# Patient Record
Sex: Male | Born: 1975 | Race: White | Hispanic: No | Marital: Married | Smoking: Never smoker
Health system: Southern US, Community
[De-identification: ages and names within clinical notes are randomized; demographics above are authoritative.]

## PROBLEM LIST (undated history)

## (undated) DIAGNOSIS — F410 Panic disorder [episodic paroxysmal anxiety] without agoraphobia: Secondary | ICD-10-CM

## (undated) DIAGNOSIS — G4730 Sleep apnea, unspecified: Secondary | ICD-10-CM

## (undated) HISTORY — DX: Sleep apnea, unspecified: G47.30

## (undated) HISTORY — DX: Panic disorder (episodic paroxysmal anxiety): F41.0

## (undated) HISTORY — PX: CHOLECYSTECTOMY: SHX55

---

## 2017-04-15 ENCOUNTER — Ambulatory Visit: Payer: Self-pay | Admitting: Osteopathic Medicine

## 2017-04-23 ENCOUNTER — Encounter: Payer: Self-pay | Admitting: Osteopathic Medicine

## 2017-04-23 ENCOUNTER — Ambulatory Visit (INDEPENDENT_AMBULATORY_CARE_PROVIDER_SITE_OTHER): Payer: 59 | Admitting: Osteopathic Medicine

## 2017-04-23 VITALS — BP 123/75 | HR 82 | Ht 72.0 in | Wt 240.0 lb

## 2017-04-23 DIAGNOSIS — F410 Panic disorder [episodic paroxysmal anxiety] without agoraphobia: Secondary | ICD-10-CM | POA: Insufficient documentation

## 2017-04-23 DIAGNOSIS — F411 Generalized anxiety disorder: Secondary | ICD-10-CM

## 2017-04-23 LAB — CBC
HCT: 44.3 % (ref 38.5–50.0)
Hemoglobin: 15.1 g/dL (ref 13.2–17.1)
MCH: 31.2 pg (ref 27.0–33.0)
MCHC: 34.1 g/dL (ref 32.0–36.0)
MCV: 91.5 fL (ref 80.0–100.0)
MPV: 10.8 fL (ref 7.5–12.5)
PLATELETS: 283 10*3/uL (ref 140–400)
RBC: 4.84 10*6/uL (ref 4.20–5.80)
RDW: 12.1 % (ref 11.0–15.0)
WBC: 10.1 10*3/uL (ref 3.8–10.8)

## 2017-04-23 LAB — COMPLETE METABOLIC PANEL WITH GFR
AG Ratio: 1.8 (calc) (ref 1.0–2.5)
ALBUMIN MSPROF: 4.5 g/dL (ref 3.6–5.1)
ALKALINE PHOSPHATASE (APISO): 90 U/L (ref 40–115)
ALT: 16 U/L (ref 9–46)
AST: 24 U/L (ref 10–40)
BILIRUBIN TOTAL: 0.5 mg/dL (ref 0.2–1.2)
BUN / CREAT RATIO: 26 (calc) — AB (ref 6–22)
BUN: 33 mg/dL — ABNORMAL HIGH (ref 7–25)
CHLORIDE: 104 mmol/L (ref 98–110)
CO2: 28 mmol/L (ref 20–32)
Calcium: 9.4 mg/dL (ref 8.6–10.3)
Creat: 1.25 mg/dL (ref 0.60–1.35)
GFR, Est African American: 84 mL/min/{1.73_m2} (ref >=60)
GFR, Est Non African American: 72 mL/min/{1.73_m2} (ref >=60)
GLOBULIN: 2.5 g/dL (ref 1.9–3.7)
Glucose, Bld: 93 mg/dL (ref 65–99)
Potassium: 4.1 mmol/L (ref 3.5–5.3)
SODIUM: 139 mmol/L (ref 135–146)
Total Protein: 7 g/dL (ref 6.1–8.1)

## 2017-04-23 LAB — TSH: TSH: 1.12 m[IU]/L (ref 0.40–4.50)

## 2017-04-23 MED ORDER — HYDROXYZINE HCL 25 MG PO TABS: 25.0000 mg | ORAL_TABLET | Freq: Three times a day (TID) | ORAL | 1 refills | Status: DC | PRN

## 2017-04-23 MED ORDER — ESCITALOPRAM OXALATE 10 MG PO TABS: 10.0000 mg | ORAL_TABLET | Freq: Every day | ORAL | 1 refills | Status: DC

## 2017-04-23 NOTE — Progress Notes (Signed)
HPI: Marc Wiggins is a 41 y.o. male who  has no past medical history on file.  [facility] today, 04/23/17,  for chief complaint of:  Chief Complaint  Patient presents with  . Establish Care    ANXIETY    Anxiety/irritability and difficulty sleeping for the past few months. Notes that anxiety issues have been a problem all of his life but seem to be getting worse lately. Wife encouraged him to come seek care. He has already been to one session with a counselor last week. Would like to continue doing this. Greatest issue is he feels like he cannot shut his mind down, this definitely affects sleeping. His grandmother gave him alprazolam which she has been taking to help him sleep. This works quite well.  Depression screen PHQ 2/9 04/23/2017  Decreased Interest 0  Down, Depressed, Hopeless 1  PHQ - 2 Score 1   GAD 7 : Generalized Anxiety Score 04/23/2017  Nervous, Anxious, on Edge 3  Control/stop worrying 3  Worry too much - different things 3  Trouble relaxing 3  Restless 3  Easily annoyed or irritable 3  Afraid - awful might happen 3  Total GAD 7 Score 21   Mood disorder questionnaire negative  Past medical, surgical, social and family history reviewed:  There are no active problems to display for this patient.   Past Surgical History:  Procedure Laterality Date  . CHOLECYSTECTOMY      Social History   Tobacco Use  . Smoking status: Never Smoker  . Smokeless tobacco: Never Used  Substance Use Topics  . Alcohol use: Not on file    Family History  Problem Relation Age of Onset  . Hypertension Father   . Diabetes Maternal Aunt   . Stroke Maternal Grandmother   . Diabetes Maternal Grandmother   . Heart attack Maternal Grandmother   . Cancer Paternal Grandmother      Current medication list and allergy/intolerance information reviewed:    Current Outpatient Medications  Medication Sig Dispense Refill  .  fexofenadine (ALLEGRA) 180 MG tablet Take 180 mg daily by mouth.    . Multiple Vitamin (MULTIVITAMIN) tablet Take 1 tablet daily by mouth.     No current facility-administered medications for this visit.     Allergies  Allergen Reactions  . Hydrocodone       Review of Systems:  Constitutional:  No  fever, no chills, No recent illness, No unintentional weight changes. No significant fatigue.   HEENT: No  headache, no vision change, no hearing change, No sore throat, No  sinus pressure  Cardiac: No  chest pain, No  pressure, No palpitations, No  Orthopnea  Respiratory:  No  shortness of breath. No  Cough  Gastrointestinal: No  abdominal pain, No  nausea, No  vomiting,  No  blood in stool, No  diarrhea, No  constipation   Musculoskeletal: No new myalgia/arthralgia  Genitourinary: No  incontinence, No  abnormal genital bleeding, No abnormal genital discharge  Skin: No  Rash, No other wounds/concerning lesions  Hem/Onc: No  easy bruising/bleeding, No  abnormal lymph node  Endocrine: No cold intolerance,  No heat intolerance. No polyuria/polydipsia/polyphagia   Neurologic: No  weakness, No  dizziness, No  slurred speech/focal weakness/facial droop  Psychiatric: No  concerns with depression, +concerns with anxiety, +sleep problems, No mood problems  Exam:  BP 123/75   Pulse 82   Ht 6' (1.829 m)   Wt 240 lb (  108.9 kg)   BMI 32.55 kg/m    Constitutional: VS see above. General Appearance: alert, well-developed, well-nourished, NAD  Eyes: Normal lids and conjunctive, non-icteric sclera  Ears, Nose, Mouth, Throat: MMM, Normal external inspection ears/nares/mouth/lips/gums. TM normal bilaterally. Pharynx/tonsils no erythema, no exudate. Nasal mucosa normal.   Neck: No masses, trachea midline. No thyroid enlargement. No tenderness/mass appreciated. No lymphadenopathy  Respiratory: Normal respiratory effort. no wheeze, no rhonchi, no rales  Cardiovascular: S1/S2 normal, no  murmur, no rub/gallop auscultated. RRR. No lower extremity edema  Gastrointestinal: Nontender, no masses. No hepatomegaly, no splenomegaly.   Musculoskeletal: Gait normal. No clubbing/cyanosis of digits.   Neurological: Normal balance/coordination. No tremor. No cranial nerve deficit on limited exam.   Skin: warm, dry, intact. No rash/ulcer. No concerning nevi or subq nodules on limited exam.    Psychiatric: Normal judgment/insight. Normal mood and affect. Oriented x3.     ASSESSMENT/PLAN:   Generalized anxiety disorder - Discussed appropriate daily use of SSRI as first-line therapy, avoid Xanax for sleep. Will trial hydroxyzine. Advised against taking other peoples prescriptions - Plan: escitalopram (LEXAPRO) 10 MG tablet, hydrOXYzine (ATARAX/VISTARIL) 25 MG tablet, CBC, COMPLETE METABOLIC PANEL WITH GFR, TSH    Patient Instructions  We are starting a medication today called lexapro to help treat your anxiety. This is a daily medication to help control your symptoms.   I also highly encourage my patients who are suffering from anxiety to seek care with a counselor or therapist. A therapist can coach you in techniques to recognize and deal with troubling thought patterns and behaviors. The ability to cope with external stressors is crucial to overall mental health.   Expect a call or message form this office in the next 2 weeks to check in: If you're doing well on the medicine but not feeling any effect, we can increase the dose. If you're starting to feel some effect/improvement, we can hold off on a dose increase and reevaluate at your office visit.   Let's plan to follow up in the office in 4 weeks. At that time, we can talk about how well the medicine is working for you, and we can consider increasing the dose, adding another medicine, etc.   If we are having trouble finding a good medication regimen for you, we can consult with a psychiatrist to assist with medication management.    If you experience problematic side effects, please let me know ASAP - we can switch the medicine any time, and we don't need an appointment for this.   Any questions or concerns, call me!      Visit summary with medication list and pertinent instructions was printed for patient to review. All questions at time of visit were answered - patient instructed to contact office with any additional concerns. ER/RTC precautions were reviewed with the patient. Follow-up plan: Return in about 4 weeks (around 05/21/2017) for recheck on new medicines, sooner if needed .  Please note: voice recognition software was used to produce this document, and typos may escape review. Please contact Dr. Lyn HollingsheadAlexander for any needed clarifications.

## 2017-04-23 NOTE — Patient Instructions (Signed)
We are starting a medication today called lexapro to help treat your anxiety. This is a daily medication to help control your symptoms.   I also highly encourage my patients who are suffering from anxiety to seek care with a counselor or therapist. A therapist can coach you in techniques to recognize and deal with troubling thought patterns and behaviors. The ability to cope with external stressors is crucial to overall mental health.   Expect a call or message form this office in the next 2 weeks to check in: If you're doing well on the medicine but not feeling any effect, we can increase the dose. If you're starting to feel some effect/improvement, we can hold off on a dose increase and reevaluate at your office visit.   Let's plan to follow up in the office in 4 weeks. At that time, we can talk about how well the medicine is working for you, and we can consider increasing the dose, adding another medicine, etc.   If we are having trouble finding a good medication regimen for you, we can consult with a psychiatrist to assist with medication management.   If you experience problematic side effects, please let me know ASAP - we can switch the medicine any time, and we don't need an appointment for this.   Any questions or concerns, call me!

## 2017-05-07 ENCOUNTER — Telehealth: Payer: Self-pay | Admitting: Osteopathic Medicine

## 2017-05-07 NOTE — Telephone Encounter (Signed)
Please call patient: Just calling to check up and see how he is doing after we started the Lexapro for anxiety issues a few weeks ago. Any problems or questions, let me know

## 2017-05-07 NOTE — Telephone Encounter (Signed)
-----   Message from Sunnie NielsenNatalie Vara Mairena, DO sent at 04/23/2017  6:27 PM EST ----- Regarding: anxiety Started lexapro 04/23/17

## 2017-05-07 NOTE — Telephone Encounter (Signed)
Left message for a return call

## 2017-05-21 ENCOUNTER — Ambulatory Visit (INDEPENDENT_AMBULATORY_CARE_PROVIDER_SITE_OTHER): Payer: 59 | Admitting: Osteopathic Medicine

## 2017-05-21 ENCOUNTER — Encounter: Payer: Self-pay | Admitting: Osteopathic Medicine

## 2017-05-21 VITALS — BP 121/75 | HR 70 | Wt 242.0 lb

## 2017-05-21 DIAGNOSIS — F411 Generalized anxiety disorder: Secondary | ICD-10-CM

## 2017-05-21 MED ORDER — ESCITALOPRAM OXALATE 10 MG PO TABS: 10.0000 mg | ORAL_TABLET | Freq: Every day | ORAL | 1 refills | Status: DC

## 2017-05-21 MED ORDER — HYDROXYZINE HCL 50 MG PO TABS: 25.0000 mg | ORAL_TABLET | Freq: Three times a day (TID) | ORAL | 1 refills | Status: DC | PRN

## 2017-05-21 MED ORDER — BUPROPION HCL ER (XL) 150 MG PO TB24: 150.0000 mg | ORAL_TABLET | Freq: Every day | ORAL | 1 refills | Status: DC

## 2017-05-21 NOTE — Progress Notes (Signed)
HPI: Marc Wiggins is a 41 y.o. male who  has no past medical history on file.  [facility] to [facility] today, 05/21/17,  for chief complaint of:  Chief Complaint  Patient presents with  . Anxiety    Anxiety/irritability and difficulty sleeping for the past few months. Notes that anxiety issues have been a problem all of his life but seem to be getting worse lately. Wife encouraged him to come seek care. He is following with a Veterinary surgeoncounselor. Would like to continue doing this. Greatest issue is he feels like he cannot shut his mind down, this definitely affects sleeping. His grandmother gave him alprazolam which he had been taking to help him sleep. This was of course working well but he and I discussed at last visit inappropriate use of other people's prescriptions, risks of benzodiazepine use long term.   Last visit one month ago, we started lexapro for daily use and advised Hydroxyzine for sleep aid. He follows up today reporting doing well overall, moods a bit better, but noticing libido/ejaculation difficulty.        Depression screen PHQ 2/9 04/23/2017  Decreased Interest 0  Down, Depressed, Hopeless 1  PHQ - 2 Score 1   GAD 7 : Generalized Anxiety Score 04/23/2017  Nervous, Anxious, on Edge 3  Control/stop worrying 3  Worry too much - different things 3  Trouble relaxing 3  Restless 3  Easily annoyed or irritable 3  Afraid - awful might happen 3  Total GAD 7 Score 21   Mood disorder questionnaire negative  Past medical, surgical, social and family history reviewed:  Patient Active Problem List   Diagnosis Date Noted  . Generalized anxiety disorder 04/23/2017    Past Surgical History:  Procedure Laterality Date  . CHOLECYSTECTOMY      Social History   Tobacco Use  . Smoking status: Never Smoker  . Smokeless tobacco: Never Used  Substance Use Topics  . Alcohol use: Not on file    Family History  Problem Relation Age of  Onset  . Hypertension Father   . Diabetes Maternal Aunt   . Stroke Maternal Grandmother   . Diabetes Maternal Grandmother   . Heart attack Maternal Grandmother   . Cancer Paternal Grandmother      Current medication list and allergy/intolerance information reviewed:    Current Outpatient Medications  Medication Sig Dispense Refill  . escitalopram (LEXAPRO) 10 MG tablet Take 1 tablet (10 mg total) daily by mouth. 30 tablet 1  . fexofenadine (ALLEGRA) 180 MG tablet Take 180 mg daily by mouth.    . hydrOXYzine (ATARAX/VISTARIL) 25 MG tablet Take 1-2 tablets (25-50 mg total) 3 (three) times daily as needed by mouth (anxiety or insomnia). 30 tablet 1  . Multiple Vitamin (MULTIVITAMIN) tablet Take 1 tablet daily by mouth.     No current facility-administered medications for this visit.     Allergies  Allergen Reactions  . Hydrocodone       Review of Systems:  Constitutional:  No  fever, no chills,  Cardiac: No  chest pain, No  pressure  Respiratory:  No  shortness of breath. No  Cough  Gastrointestinal: No  abdominal pain, No  nausea  Skin: No  Rash  Neurologic: No  weakness, No  dizziness  Psychiatric: No  concerns with depression, +concerns with anxiety, +sleep problems, No mood problems  Exam:  BP 121/75   Pulse 70   Wt 242 lb (109.8 kg)  BMI 32.82 kg/m   Constitutional: VS see above. General Appearance: alert, well-developed, well-nourished, NAD  Ears, Nose, Mouth, Throat: MMM, Normal external inspection ears/nares/mouth/lips/gums.  Respiratory: Normal respiratory effort.   Musculoskeletal: Gait normal.   Neurological: Normal balance/coordination. No tremor. Marland Kitchen.    Psychiatric: Normal judgment/insight. Normal mood and affect. Oriented x3.     ASSESSMENT/PLAN:   Generalized anxiety disorder - Plan: escitalopram (LEXAPRO) 10 MG tablet, hydrOXYzine (ATARAX/VISTARIL) 50 MG tablet, DISCONTINUED: escitalopram (LEXAPRO) 10 MG tablet, DISCONTINUED: hydrOXYzine  (ATARAX/VISTARIL) 50 MG tablet  Generalized anxiety disorder -   - Plan: escitalopram (LEXAPRO) 10 MG tablet, hydrOXYzine (ATARAX/VISTARIL) 50 MG tablet, DISCONTINUED: escitalopram (LEXAPRO) 10 MG tablet, DISCONTINUED: hydrOXYzine (ATARAX/VISTARIL) 50 MG tablet    Patient Instructions   Will try Lexapro 10 mg plus Wellbutrin 150 mg - this will hopefully alleviate side effects  We can stop or decrease the Lexapro if you'd like and just stay on the Wellbutrin if the sexual side effects persists     Visit summary with medication list and pertinent instructions was printed for patient to review. All questions at time of visit were answered - patient instructed to contact office with any additional concerns. ER/RTC precautions were reviewed with the patient.   Follow-up plan: Return in about 4 weeks (around 06/18/2017) for recheck on medications, sooner if needed.  Please note: voice recognition software was used to produce this document, and typos may escape review. Please contact Dr. Lyn HollingsheadAlexander for any needed clarifications.

## 2017-05-21 NOTE — Patient Instructions (Addendum)
   Will try Lexapro 10 mg plus Wellbutrin 150 mg - this will hopefully alleviate side effects  We can stop or decrease the Lexapro if you'd like and just stay on the Wellbutrin if the sexual side effects persists

## 2017-05-22 ENCOUNTER — Encounter: Payer: Self-pay | Admitting: Osteopathic Medicine

## 2017-06-18 ENCOUNTER — Ambulatory Visit: Payer: 59 | Admitting: Osteopathic Medicine

## 2017-07-02 ENCOUNTER — Ambulatory Visit: Payer: Self-pay | Admitting: Osteopathic Medicine

## 2017-12-08 ENCOUNTER — Encounter: Payer: Self-pay | Admitting: Osteopathic Medicine

## 2018-02-24 ENCOUNTER — Other Ambulatory Visit: Payer: Self-pay

## 2018-02-24 ENCOUNTER — Emergency Department
Admission: EM | Admit: 2018-02-24 | Discharge: 2018-02-24 | Disposition: A | Discharge status: Home / Self Care | Payer: No Typology Code available for payment source | Source: Home / Self Care | Attending: Family Medicine | Admitting: Family Medicine

## 2018-02-24 DIAGNOSIS — R197 Diarrhea, unspecified: Secondary | ICD-10-CM | POA: Diagnosis not present

## 2018-02-24 DIAGNOSIS — R1013 Epigastric pain: Secondary | ICD-10-CM | POA: Diagnosis not present

## 2018-02-24 DIAGNOSIS — R112 Nausea with vomiting, unspecified: Secondary | ICD-10-CM

## 2018-02-24 LAB — COMPLETE METABOLIC PANEL WITH GFR
AG Ratio: 1.8 (calc) (ref 1.0–2.5)
ALT: 18 U/L (ref 9–46)
AST: 18 U/L (ref 10–40)
Albumin: 4 g/dL (ref 3.6–5.1)
Alkaline phosphatase (APISO): 66 U/L (ref 40–115)
BUN: 13 mg/dL (ref 7–25)
CO2: 29 mmol/L (ref 20–32)
Calcium: 9.2 mg/dL (ref 8.6–10.3)
Chloride: 104 mmol/L (ref 98–110)
Creat: 1 mg/dL (ref 0.60–1.35)
GFR, Est African American: 109 mL/min/{1.73_m2} (ref >=60)
GFR, Est Non African American: 94 mL/min/{1.73_m2} (ref >=60)
Globulin: 2.2 g/dL (calc) (ref 1.9–3.7)
Glucose, Bld: 117 mg/dL — ABNORMAL HIGH (ref 65–99)
Potassium: 4.2 mmol/L (ref 3.5–5.3)
Sodium: 140 mmol/L (ref 135–146)
Total Bilirubin: 0.6 mg/dL (ref 0.2–1.2)
Total Protein: 6.2 g/dL (ref 6.1–8.1)

## 2018-02-24 LAB — POCT CBC W AUTO DIFF (K'VILLE URGENT CARE)

## 2018-02-24 LAB — LIPASE: Lipase: 20 U/L (ref 7–60)

## 2018-02-24 MED ORDER — PROMETHAZINE HCL 25 MG PO TABS: 25.0000 mg | ORAL_TABLET | Freq: Four times a day (QID) | ORAL | 0 refills | Status: DC | PRN

## 2018-02-24 NOTE — ED Triage Notes (Signed)
Pt x/o nausea, vomitting and diarrhea x 2 days. Can't keep anything down. Taking zofran prn. Describes a burning feeling in his stomach. Some sweats/chills associated with.

## 2018-02-24 NOTE — Discharge Instructions (Addendum)
° °  Your symptoms appear to be due to a stomach virus.  Be sure to stay well hydrated with clear fluids including Gatorade and/or Pedialyte.  Avoid fried fatty foods and dairy (milk, cheese, butter, yogurt) which can cause your cramping, nausea, vomiting and diarrhea to worsen.  Please follow up with primary care in 2-3 days if not improving.  Please call 911 or go to the hospital if you cannot keep down fluids, abdominal pain worsens, or other new concerning symptoms develop.

## 2018-02-24 NOTE — ED Provider Notes (Signed)
[name]    CSN: [id number] Arrival date & time: 02/24/18  0845     History   Chief Complaint Chief Complaint  Patient presents with  . Nausea    with vomiting    HPI Marc Wiggins is a 42 y.o. male.   HPI Marc Wiggins is a 42 y.o. male presenting to UC with c/o nausea, vomiting, and diarrhea that stared 2 days ago.  associated burning epigastric and central abdominal pain that is mild to moderate in severity.  Last episode of vomiting was about 24 hours ago but in the last 24 hours he has continued to have about 10-12 episodes of diarrhea per his wife. Pt states the diarrhea has gradually slowed in frequency and his stool has started to slightly reform some and not be quite as watery but pt was too sick to go to work as planned today. Wife also notes she is concerned about dehydration due to all the diarrhea.  Pt has been able to keep down Gatorade and bread since last night.  He has taken Zofran which has helped the vomiting but he still feels nauseated.  No fever but he has had chills. No known sick contacts or recent travel. Wife ate everything pt ate this past weekend. Pt has had a cholecystectomy.    History reviewed. No pertinent past medical history.  Patient Active Problem List   Diagnosis Date Noted  . Generalized anxiety disorder 04/23/2017    Past Surgical History:  Procedure Laterality Date  . CHOLECYSTECTOMY         Home Medications    Prior to Admission medications   Medication Sig Start Date End Date Taking? Authorizing Provider  buPROPion (WELLBUTRIN XL) 150 MG 24 hr tablet Take 1 tablet (150 mg total) by mouth daily. 05/21/17   [name], DO  escitalopram (LEXAPRO) 10 MG tablet Take 1 tablet (10 mg total) by mouth daily. 05/21/17   [name], DO  fexofenadine (ALLEGRA) 180 MG tablet Take 180 mg daily by mouth.    [provider]  hydrOXYzine (ATARAX/VISTARIL) 50 MG tablet Take 0.5-1 tablets (25-50 mg  total) by mouth 3 (three) times daily as needed (anxiety or insomnia). 05/21/17   [name], DO  Multiple Vitamin (MULTIVITAMIN) tablet Take 1 tablet daily by mouth.    [provider]  promethazine (PHENERGAN) 25 MG tablet Take 1 tablet (25 mg total) by mouth every 6 (six) hours as needed. 02/24/18   [name], PA-C    Family History Family History  Problem Relation Age of Onset  . Hypertension Father   . Diabetes Maternal Aunt   . Stroke Maternal Grandmother   . Diabetes Maternal Grandmother   . Heart attack Maternal Grandmother   . Cancer Paternal Grandmother     Social History Social History   Tobacco Use  . Smoking status: Never Smoker  . Smokeless tobacco: Never Used  Substance Use Topics  . Alcohol use: Never    Frequency: Never  . Drug use: Never     Allergies   Hydrocodone   Review of Systems Review of Systems  Constitutional: Positive for chills and fatigue. Negative for fever.  HENT: Negative for congestion, ear pain, sore throat, trouble swallowing and voice change.   Respiratory: Negative for cough and shortness of breath.   Cardiovascular: Negative for chest pain and palpitations.  Gastrointestinal: Positive for abdominal pain, diarrhea, nausea and vomiting.  Genitourinary: Negative for decreased urine volume, dysuria and frequency.  Musculoskeletal: Negative  for arthralgias, back pain and myalgias.  Skin: Negative for rash.     Physical Exam Triage Vital Signs ED Triage Vitals [02/24/18 0906]  Enc Vitals Group     BP 123/76     Pulse Rate (!) 58     Resp      Temp 98.3 F (36.8 C)     Temp Source Oral     SpO2 100 %     Weight 241 lb (109.3 kg)     Height      Head Circumference      Peak Flow      Pain Score 0     Pain Loc      Pain Edu?      Excl. in GC?    No data found.  Updated Vital Signs BP 123/76 (BP Location: Right Arm)   Pulse (!) 58   Temp 98.3 F (36.8 C) (Oral)   Wt 241 lb (109.3 kg)   SpO2  100%   BMI 32.69 kg/m   Visual Acuity Right Eye Distance:   Left Eye Distance:   Bilateral Distance:    Right Eye Near:   Left Eye Near:    Bilateral Near:     Physical Exam  Constitutional: He is oriented to person, place, and time. He appears well-developed and well-nourished. No distress.  HENT:  Head: Normocephalic and atraumatic.  Mouth/Throat: Oropharynx is clear and moist.  Eyes: EOM are normal.  Neck: Normal range of motion. Neck supple.  Cardiovascular: Normal rate and regular rhythm.  Pulmonary/Chest: Effort normal and breath sounds normal. No stridor. No respiratory distress. He has no wheezes. He has no rales.  Abdominal: Soft. He exhibits no distension. There is tenderness.    Musculoskeletal: Normal range of motion.  Neurological: He is alert and oriented to person, place, and time.  Skin: Skin is warm and dry. He is not diaphoretic.  Psychiatric: He has a normal mood and affect. His behavior is normal.  Nursing note and vitals reviewed.    UC Treatments / Results  Labs (all labs ordered are listed, but only abnormal results are displayed) Labs Reviewed  COMPLETE METABOLIC PANEL WITH GFR  LIPASE  POCT CBC W AUTO DIFF ([facility])    EKG None  Radiology No results found.  Procedures Procedures (including critical care time)  Medications Ordered in UC Medications - No data to display  Initial Impression / Assessment and Plan / UC Course  I have reviewed the triage vital signs and the nursing notes.  Pertinent labs & imaging results that were available during my care of the patient were reviewed by me and considered in my medical decision making (see chart for details).     Hx and exam c/w viral gastroenteritis. CBC: WNL CMP and Lipase: pending Home instructions provided.  Final Clinical Impressions(s) / UC Diagnoses   Final diagnoses:  Abdominal pain, epigastric  Nausea vomiting and diarrhea     Discharge Instructions         Your symptoms appear to be due to a stomach virus.  Be sure to stay well hydrated with clear fluids including Gatorade and/or Pedialyte.  Avoid fried fatty foods and dairy (milk, cheese, butter, yogurt) which can cause your cramping, nausea, vomiting and diarrhea to worsen.  Please follow up with primary care in 2-3 days if not improving.  Please call 911 or go to the hospital if you cannot keep down fluids, abdominal pain worsens, or other new concerning symptoms develop.  ED Prescriptions    Medication Sig Dispense Auth. Provider   promethazine (PHENERGAN) 25 MG tablet Take 1 tablet (25 mg total) by mouth every 6 (six) hours as needed. 10 tablet [name], PA-C     Controlled Substance Prescriptions [place] Controlled Substance Registry consulted? Not Applicable   Rolla Plate 02/24/18 4540

## 2018-02-25 ENCOUNTER — Telehealth: Payer: Self-pay | Admitting: *Deleted

## 2018-02-25 NOTE — Telephone Encounter (Signed)
LM with lab results. F/u with PCP about his glucose if he was fasting for his lab draw.

## 2018-06-15 ENCOUNTER — Encounter: Payer: Self-pay | Admitting: Osteopathic Medicine

## 2018-06-15 ENCOUNTER — Ambulatory Visit (INDEPENDENT_AMBULATORY_CARE_PROVIDER_SITE_OTHER): Payer: No Typology Code available for payment source | Admitting: Osteopathic Medicine

## 2018-06-15 DIAGNOSIS — F5105 Insomnia due to other mental disorder: Secondary | ICD-10-CM | POA: Diagnosis not present

## 2018-06-15 DIAGNOSIS — F321 Major depressive disorder, single episode, moderate: Secondary | ICD-10-CM

## 2018-06-15 DIAGNOSIS — F411 Generalized anxiety disorder: Secondary | ICD-10-CM

## 2018-06-15 MED ORDER — ZOLPIDEM TARTRATE 5 MG PO TABS: 5.0000 mg | ORAL_TABLET | Freq: Every evening | ORAL | 0 refills | Status: DC | PRN

## 2018-06-15 MED ORDER — VORTIOXETINE HBR 10 MG PO TABS: 10.0000 mg | ORAL_TABLET | Freq: Every day | ORAL | 11 refills | Status: DC

## 2018-06-15 MED FILL — ZOLPIDEM TARTRATE 5 MG TAB: 5 | 15 days supply | Qty: 15 | Fill #0

## 2018-06-15 NOTE — Progress Notes (Signed)
xxaproHPI: Marc Wiggins is a 43 y.o. male who  has no past medical history on file.  he presents to [facility] today, 06/15/18,  for chief complaint of: Anxiety, sleep problems  Last seen 05/2017 (>1 year ago) for longstanding anxiety issues. Felt at that time that overactive/racing thoughts kept him from sleeping, was taking his grandmother's alprazolam which was of course helping, we decided to start Lexapro daily and prn Hydroxyzine for sleep aid, these helped but he experiences sexual side effects. Trial Lexapro 10 mg + Wellbutrin 150 mg w/ option to decrease or stop the Lexapro. Pt was advised f/u 06/2017, 4 weeks after last visit, but we have not seen him. Previous MDQ negative.   Reports he was feeling okay, but stopped the Lexapro due to side effects, Wellbutrin was not helpful. Worsening symptoms over the past month or so, but past few months have been rough. Feeling on edge. Problems staying asleep. Gets to sleep ok then wakes up around 2:00 am every night and unable to get back to sleep. More emotional, crying, overthinking.    Depression screen [facility] 2/9 06/15/2018 05/21/2017 04/23/2017  Decreased Interest 2 1 0  Down, Depressed, Hopeless 2 0 1  PHQ - 2 Score 4 1 1   Altered sleeping 2 2 -  Tired, decreased energy 0 0 -  Change in appetite 0 0 -  Feeling bad or failure about yourself  1 1 -  Trouble concentrating 1 0 -  Moving slowly or fidgety/restless 0 0 -  Suicidal thoughts 0 0 -  PHQ-9 Score 8 4 -  Difficult doing work/chores Somewhat difficult - -   GAD 7 : Generalized Anxiety Score 06/15/2018 05/21/2017 04/23/2017  Nervous, Anxious, on Edge 3 1 3   Control/stop worrying 3 1 3   Worry too much - different things 3 1 3   Trouble relaxing 3 1 3   Restless 0 0 3  Easily annoyed or irritable 1 0 3  Afraid - awful might happen 0 0 3  Total GAD 7 Score 13 4 21   Anxiety Difficulty Somewhat difficult - -         Past medical,  surgical, social and family history reviewed:  Patient Active Problem List   Diagnosis Date Noted  . Generalized anxiety disorder 04/23/2017    Past Surgical History:  Procedure Laterality Date  . CHOLECYSTECTOMY      Social History   Tobacco Use  . Smoking status: Never Smoker  . Smokeless tobacco: Never Used  Substance Use Topics  . Alcohol use: Never    Frequency: Never    Family History  Problem Relation Age of Onset  . Hypertension Father   . Diabetes Maternal Aunt   . Stroke Maternal Grandmother   . Diabetes Maternal Grandmother   . Heart attack Maternal Grandmother   . Cancer Paternal Grandmother      Current medication list and allergy/intolerance information reviewed:    Current Outpatient Medications  Medication Sig Dispense Refill  . buPROPion (WELLBUTRIN XL) 150 MG 24 hr tablet Take 1 tablet (150 mg total) by mouth daily. 30 tablet 1  . escitalopram (LEXAPRO) 10 MG tablet Take 1 tablet (10 mg total) by mouth daily. 30 tablet 1  . fexofenadine (ALLEGRA) 180 MG tablet Take 180 mg daily by mouth.    . hydrOXYzine (ATARAX/VISTARIL) 50 MG tablet Take 0.5-1 tablets (25-50 mg total) by mouth 3 (three) times daily as needed (anxiety or insomnia). 30 tablet 1  . Multiple Vitamin (  MULTIVITAMIN) tablet Take 1 tablet daily by mouth.    . promethazine (PHENERGAN) 25 MG tablet Take 1 tablet (25 mg total) by mouth every 6 (six) hours as needed. 10 tablet 0   No current facility-administered medications for this visit.     Allergies  Allergen Reactions  . Hydrocodone       Review of Systems:  Constitutional:  No  fever, no chills, No recent illness, No unintentional weight changes. No significant fatigue.   HEENT: No  headache, no vision change, no hearing change, No sore throat, No  sinus pressure  Cardiac: No  chest pain, No  pressure, No palpitations, No  Orthopnea  Respiratory:  No  shortness of breath. No  Cough  Gastrointestinal: No  abdominal pain, No   nausea, No  vomiting  Musculoskeletal: No new myalgia/arthralgia  Neurologic: No  weakness, No  dizziness  Psychiatric: +concerns with depression, +concerns with anxiety, +sleep problems, No mood problems  Exam:  BP 123/79 (BP Location: Left Arm, Patient Position: Sitting, Cuff Size: Normal)   Pulse 91   Temp 98.6 F (37 C) (Oral)   Wt 235 lb 14.4 oz (107 kg)   BMI 31.99 kg/m   Constitutional: VS see above. General Appearance: alert, well-developed, well-nourished, NAD  Eyes: Normal lids and conjunctive, non-icteric sclera  Ears, Nose, Mouth, Throat: MMM, Normal external inspection ears/nares/mouth/lips/gums.  Neck: No masses, trachea midline.   Respiratory: Normal respiratory effort.   Psychiatric: Normal judgment/insight. Anxious, tearful mood and affect. Oriented x3.      ASSESSMENT/PLAN: Diagnoses of Current moderate episode of major depressive disorder without prior episode (HCC), Generalized anxiety disorder, and Insomnia due to mental condition were pertinent to this visit.  Unable to tolerate Lexapro d/t sexual side effects Failed tx Wellbutrin and Hydroxyzine Trial Trintellix   Meds ordered this encounter  Medications  . vortioxetine HBr (TRINTELLIX) 10 MG TABS tablet    Sig: Take 1 tablet (10 mg total) by mouth daily.    Dispense:  30 tablet    Refill:  11  . zolpidem (AMBIEN) 5 MG tablet    Sig: Take 1 tablet (5 mg total) by mouth at bedtime as needed for sleep. Take nightly for one week, then as needed after that.    Dispense:  15 tablet    Refill:  0     Patient Instructions   Will trial Trintellix for depression/anxiety (may require prior authorization process to be completed by pharmacy/us/insurance).   Will trial nightly Ambien for one week for insomnia.   Call me if any issues!          Visit summary with medication list and pertinent instructions was printed for patient to review. All questions at time of visit were answered -  patient instructed to contact office with any additional concerns or updates. ER/RTC precautions were reviewed with the patient.    Please note: voice recognition software was used to produce this document, and typos may escape review. Please contact Dr. Lyn HollingsheadAlexander for any needed clarifications.     Follow-up plan: Return in about 2 weeks (around 06/29/2018) for recheck on medications - sooner if needed .

## 2018-06-15 NOTE — Patient Instructions (Addendum)
   Will trial Trintellix for depression/anxiety (may require prior authorization process to be completed by pharmacy/us/insurance).   Will trial nightly Ambien for one week for insomnia.   Call me if any issues!

## 2018-06-16 ENCOUNTER — Telehealth: Payer: Self-pay | Admitting: Osteopathic Medicine

## 2018-06-16 NOTE — Telephone Encounter (Signed)
Received fax from [employer] that Trintellix requires a PA. Information has been sent to the insurance company. Awaiting determination.   

## 2018-06-18 MED FILL — TRINTELLIX 10 MG TABLET: 10 | 30 days supply | Qty: 30 | Fill #0

## 2018-06-18 NOTE — Telephone Encounter (Signed)
Received fax from [employer] that Trintillix was approved from 06/17/2018 through 06/17/2019 Pharmacy notified and form sent to scan.

## 2018-06-29 ENCOUNTER — Ambulatory Visit (INDEPENDENT_AMBULATORY_CARE_PROVIDER_SITE_OTHER): Payer: No Typology Code available for payment source | Admitting: Osteopathic Medicine

## 2018-06-29 ENCOUNTER — Encounter: Payer: Self-pay | Admitting: Osteopathic Medicine

## 2018-06-29 VITALS — BP 135/75 | HR 91 | Temp 98.5°F | Wt 233.1 lb

## 2018-06-29 DIAGNOSIS — F321 Major depressive disorder, single episode, moderate: Secondary | ICD-10-CM | POA: Insufficient documentation

## 2018-06-29 DIAGNOSIS — F411 Generalized anxiety disorder: Secondary | ICD-10-CM

## 2018-06-29 DIAGNOSIS — F5105 Insomnia due to other mental disorder: Secondary | ICD-10-CM | POA: Diagnosis not present

## 2018-06-29 HISTORY — DX: Major depressive disorder, single episode, moderate: F32.1

## 2018-06-29 MED ORDER — DULOXETINE HCL 20 MG PO CPEP
20.0000 mg | ORAL_CAPSULE | Freq: Every day | ORAL | 1 refills | Status: DC
Start: 2018-06-29 — End: 2018-09-17

## 2018-06-29 MED ORDER — TRAZODONE HCL 50 MG PO TABS: 25.0000 mg | ORAL_TABLET | Freq: Every evening | ORAL | 3 refills | Status: DC | PRN

## 2018-06-29 MED FILL — DULOXETINE HCL 20 MG CPEP: 20 | 30 days supply | Qty: 30 | Fill #0

## 2018-06-29 MED FILL — traZODone HCL 50 MG TABS: 50 | 30 days supply | Qty: 30 | Fill #0

## 2018-06-29 NOTE — Progress Notes (Signed)
HPI: Marc ArtistChristopher Wiggins is a 43 y.o. male who  has no past medical history on file.  he presents to [facility] today, 06/29/18,  for chief complaint of:  Follow-up moods: Depression / Anxiety   Lexapro caused sexual side effects, Wellbutrin ineffective even in combo w/ Lexapro, Trintellix caused headache, he has taken the Trintellix for about 2 weeks at this point but low-grade headache seems to still be bothering him.  He stopped the medication the night before last, headache is improved but not totally resolved.  Insomnia: opted to trial one week of Ambien to see if we can get his sleep schedule back on track.  It was doing okay for about a week then he tried to come off of that and was noticing some insomnia returning almost immediately.   [facility] 06/15/2018 05/21/2017 04/23/2017  Decreased Interest 2 1 0  Down, Depressed, Hopeless 2 0 1  PHQ - 2 Score 4 1 1   Altered sleeping 2 2 -  Tired, decreased energy 0 0 -  Change in appetite 0 0 -  Feeling bad or failure about yourself  1 1 -  Trouble concentrating 1 0 -  Moving slowly or fidgety/restless 0 0 -  Suicidal thoughts 0 0 -  PHQ-9 Score 8 4 -  Difficult doing work/chores Somewhat difficult - -   GAD 7 : Generalized Anxiety Score 06/15/2018 05/21/2017 04/23/2017  Nervous, Anxious, on Edge 3 1 3   Control/stop worrying 3 1 3   Worry too much - different things 3 1 3   Trouble relaxing 3 1 3   Restless 0 0 3  Easily annoyed or irritable 1 0 3  Afraid - awful might happen 0 0 3  Total GAD 7 Score 13 4 21   Anxiety Difficulty Somewhat difficult - -       At today's visit... Past medical history, surgical history, and family history reviewed and updated as needed.  Current medication list and allergy/intolerance information reviewed and updated as needed. (See remainder of HPI, ROS, Phys Exam below)          ASSESSMENT/PLAN: The primary encounter diagnosis was  Generalized anxiety disorder. Diagnoses of Current moderate episode of major depressive disorder without prior episode (HCC) and Insomnia due to mental condition were also pertinent to this visit.   Anxiety and insomnia are greatest issues, component of major depression as well.  I think since we have tried SSRI alone plus with Wellbutrin, newer Trintellix, so let's  at least try SNRI, with TCA to help sleep as opposed to sedative-hypnotic.    Let's involve psychiatry to help with medication management. Referral placed.    I also highly encouraged him to get set up with our counselor who will be starting in the office in the next couple of weeks, referral was placed.   Orders Placed This Encounter  Procedures  . Ambulatory referral to Psychiatry  . Ambulatory referral to Behavioral Health     Meds ordered this encounter  Medications  . DULoxetine (CYMBALTA) 20 MG capsule    Sig: Take 1 capsule (20 mg total) by mouth daily.    Dispense:  30 capsule    Refill:  1  . traZODone (DESYREL) 50 MG tablet    Sig: Take 0.5-2 tablets (25-100 mg total) by mouth at bedtime as needed for sleep.    Dispense:  30 tablet    Refill:  3    Patient Instructions  We are starting a medication  today called duloxetine aka CYmbalta to help treat your anxiety. This is a daily medication to help control your symptoms.   I also highly encourage my patients who are suffering from anxiety to seek care with a counselor or therapist. A therapist can coach you in techniques to recognize and deal with troubling thought patterns and behaviors. The ability to cope with external stressors is crucial to overall mental health. I have placed a referral to behavioral health for counseling/therapy in our office when [name] starts on February 10th.   Try the Trazodone as-needed for sleep. Start with 0.5 tablet and work your way up to maximum 2 tablets per night.   Let's plan to follow up in the office in 4 weeks - can  cancel if you're able to get in to see a psychiatrist in that time.    If you experience problematic side effects, please let me know ASAP - we can switch the medicine any time, and we don't need an appointment for this.   For immediate mental health services:  [facility], [address] [place], [place] [address], [phone]  [facility], [address] [place], [place] [address], [phone]  Any emergency room  National Suicide Prevention Lifeline, [phone]  Any questions or concerns, call or message me!       Follow-up plan: Return in about 4 weeks (around 07/27/2018) for recheck moods and sleep - sooner if needed.                             ############################################ ############################################ ############################################ ############################################    Current Meds  Medication Sig  . fexofenadine (ALLEGRA) 180 MG tablet Take 180 mg daily by mouth.  . Multiple Vitamin (MULTIVITAMIN) tablet Take 1 tablet daily by mouth.  . vortioxetine HBr (TRINTELLIX) 10 MG TABS tablet Take 1 tablet (10 mg total) by mouth daily.  Marland Kitchen zolpidem (AMBIEN) 5 MG tablet Take 1 tablet (5 mg total) by mouth at bedtime as needed for sleep. Take nightly for one week, then as needed after that.    Allergies  Allergen Reactions  . Hydrocodone        Review of Systems:  Constitutional: No recent illness  Cardiac: No  chest pain, No  pressure, No palpitations  Respiratory:  No  shortness of breath. No  Cough  Musculoskeletal: No new myalgia/arthralgia  Skin: No  Rash  Neurologic: No  weakness, No  Dizziness  Psychiatric: +concerns with depression, +concerns with anxiety  Exam:  BP 135/75 (BP Location: Left Arm, Patient Position: Sitting, Cuff Size: Normal)   Pulse 91   Temp 98.5 F (36.9 C) (Oral)   Wt 233 lb 1.6 oz (105.7 kg)    BMI 31.61 kg/m   Constitutional: VS see above. General Appearance: alert, well-developed, well-nourished, NAD  Neck: No masses, trachea midline.   Respiratory: Normal respiratory effort.  Musculoskeletal: Gait normal. Symmetric and independent movement of all extremities  Neurological: Normal balance/coordination. No tremor.  Skin: warm, dry, intact.   Psychiatric: Normal judgment/insight. Normal mood and affect. Oriented x3.       Visit summary with medication list and pertinent instructions was printed for patient to review, patient was advised to alert [place] if any updates are needed. All questions at time of visit were answered - patient instructed to contact office with any additional concerns. ER/RTC precautions were reviewed with the patient and understanding verbalized.     Please  note: voice recognition software was used to produce this document, and typos may escape review. Please contact Dr. Lyn Hollingshead for any needed clarifications.    Follow up plan: Return in about 4 weeks (around 07/27/2018) for recheck moods and sleep - sooner if needed.

## 2018-06-29 NOTE — Patient Instructions (Signed)
We are starting a medication today called duloxetine aka CYmbalta to help treat your anxiety. This is a daily medication to help control your symptoms.   I also highly encourage my patients who are suffering from anxiety to seek care with a counselor or therapist. A therapist can coach you in techniques to recognize and deal with troubling thought patterns and behaviors. The ability to cope with external stressors is crucial to overall mental health. I have placed a referral to behavioral health for counseling/therapy in our office when [name] starts on February 10th.   Try the Trazodone as-needed for sleep. Start with 0.5 tablet and work your way up to maximum 2 tablets per night.   Let's plan to follow up in the office in 4 weeks - can cancel if you're able to get in to see a psychiatrist in that time.    If you experience problematic side effects, please let me know ASAP - we can switch the medicine any time, and we don't need an appointment for this.   For immediate mental health services:  [facility], [address] [place], [place] [address], [phone]  [facility], [address], [place], [place] [address], [phone]  Any emergency room  National Suicide Prevention Lifeline, [phone]  Any questions or concerns, call or message me!

## 2018-07-27 ENCOUNTER — Telehealth: Payer: Self-pay | Admitting: Osteopathic Medicine

## 2018-07-27 ENCOUNTER — Ambulatory Visit: Payer: No Typology Code available for payment source | Admitting: Osteopathic Medicine

## 2018-07-27 MED FILL — traZODone HCL 50 MG TABS: 50 | 15 days supply | Qty: 30 | Fill #0

## 2018-07-27 NOTE — Telephone Encounter (Signed)
Marchelle Folks called stating that patient will not make appointment to mandatory work meeting. She called at 8:04 to cancel. No further questions at this time.

## 2018-07-28 ENCOUNTER — Ambulatory Visit: Payer: No Typology Code available for payment source | Admitting: Psychology

## 2018-08-27 ENCOUNTER — Other Ambulatory Visit: Payer: Self-pay

## 2018-08-27 ENCOUNTER — Ambulatory Visit (INDEPENDENT_AMBULATORY_CARE_PROVIDER_SITE_OTHER): Payer: No Typology Code available for payment source | Admitting: Osteopathic Medicine

## 2018-08-27 VITALS — Temp 98.1°F | Wt 245.0 lb

## 2018-08-27 DIAGNOSIS — Z23 Encounter for immunization: Secondary | ICD-10-CM

## 2018-08-27 MED FILL — traZODone HCL 50 MG TABS: 50 | 15 days supply | Qty: 30 | Fill #1

## 2018-08-27 NOTE — Progress Notes (Signed)
Established Patient Office Visit  Subjective:  Patient ID: Marc Wiggins, male    DOB: 10/24/1975  Age: 43 y.o. MRN: [id number]  CC:  Chief Complaint  Patient presents with  . Immunizations    influenza vaccine    HPI Marc Wiggins presents for flu vaccine. Immunization given in right deltoid without any complications.  No past medical history on file.  Past Surgical History:  Procedure Laterality Date  . CHOLECYSTECTOMY      Family History  Problem Relation Age of Onset  . Hypertension Father   . Diabetes Maternal Aunt   . Stroke Maternal Grandmother   . Diabetes Maternal Grandmother   . Heart attack Maternal Grandmother   . Cancer Paternal Grandmother     Social History   Socioeconomic History  . Marital status: Married    Spouse name: Not on file  . Number of children: Not on file  . Years of education: Not on file  . Highest education level: Not on file  Occupational History  . Not on file  Social Needs  . Financial resource strain: Not on file  . Food insecurity:    Worry: Not on file    Inability: Not on file  . Transportation needs:    Medical: Not on file    Non-medical: Not on file  Tobacco Use  . Smoking status: Never Smoker  . Smokeless tobacco: Never Used  Substance and Sexual Activity  . Alcohol use: Never    Frequency: Never  . Drug use: Never  . Sexual activity: Not on file  Lifestyle  . Physical activity:    Days per week: Not on file    Minutes per session: Not on file  . Stress: Not on file  Relationships  . Social connections:    Talks on phone: Not on file    Gets together: Not on file    Attends religious service: Not on file    Active member of club or organization: Not on file    Attends meetings of clubs or organizations: Not on file    Relationship status: Not on file  . Intimate partner violence:    Fear of current or ex partner: Not on file    Emotionally abused: Not on file    Physically abused: Not on  file    Forced sexual activity: Not on file  Other Topics Concern  . Not on file  Social History Narrative  . Not on file    Outpatient Medications Prior to Visit  Medication Sig Dispense Refill  . DULoxetine (CYMBALTA) 20 MG capsule Take 1 capsule (20 mg total) by mouth daily. 30 capsule 1  . fexofenadine (ALLEGRA) 180 MG tablet Take 180 mg daily by mouth.    . Multiple Vitamin (MULTIVITAMIN) tablet Take 1 tablet daily by mouth.    . traZODone (DESYREL) 50 MG tablet Take 0.5-2 tablets (25-100 mg total) by mouth at bedtime as needed for sleep. 30 tablet 3  . zolpidem (AMBIEN) 5 MG tablet Take 1 tablet (5 mg total) by mouth at bedtime as needed for sleep. Take nightly for one week, then as needed after that. 15 tablet 0   No facility-administered medications prior to visit.     Allergies  Allergen Reactions  . Hydrocodone     ROS Review of Systems    Objective:    Physical Exam  Temp 98.1 F (36.7 C)   Wt 245 lb (111.1 kg)   BMI 33.23 kg/m  Wt Readings  from Last 3 Encounters:  08/27/18 245 lb (111.1 kg)  06/29/18 233 lb 1.6 oz (105.7 kg)  06/15/18 235 lb 14.4 oz (107 kg)     Health Maintenance Due  Topic Date Due  . HIV Screening  07/26/1992    There are no preventive care reminders to display for this patient.  Lab Results  Component Value Date   TSH 1.12 04/23/2017   Lab Results  Component Value Date   WBC 10.1 04/23/2017   HGB 15.1 04/23/2017   HCT 44.3 04/23/2017   MCV 91.5 04/23/2017   PLT 283 04/23/2017   Lab Results  Component Value Date   NA 140 02/24/2018   K 4.2 02/24/2018   CO2 29 02/24/2018   GLUCOSE 117 (H) 02/24/2018   BUN 13 02/24/2018   CREATININE 1.00 02/24/2018   BILITOT 0.6 02/24/2018   AST 18 02/24/2018   ALT 18 02/24/2018   PROT 6.2 02/24/2018   CALCIUM 9.2 02/24/2018   No results found for: CHOL No results found for: HDL No results found for: LDLCALC No results found for: TRIG No results found for: CHOLHDL No  results found for: HGBA1C    Assessment & Plan:   Problem List Items Addressed This Visit    None    Visit Diagnoses    Influenza vaccine needed    -  Primary   Relevant Orders   Flu Vaccine QUAD 6+ mos PF IM (Fluarix Quad PF) (Completed)      No orders of the defined types were placed in this encounter.   Follow-up: Return in about 1 year (around 08/27/2019) for flu vaccine.    Normand Sloop, LPN

## 2018-09-17 ENCOUNTER — Ambulatory Visit (INDEPENDENT_AMBULATORY_CARE_PROVIDER_SITE_OTHER): Payer: No Typology Code available for payment source | Admitting: Osteopathic Medicine

## 2018-09-17 ENCOUNTER — Encounter: Payer: Self-pay | Admitting: Osteopathic Medicine

## 2018-09-17 VITALS — Wt 238.0 lb

## 2018-09-17 DIAGNOSIS — I83891 Varicose veins of right lower extremities with other complications: Secondary | ICD-10-CM | POA: Diagnosis not present

## 2018-09-17 DIAGNOSIS — F5105 Insomnia due to other mental disorder: Secondary | ICD-10-CM

## 2018-09-17 DIAGNOSIS — R0683 Snoring: Secondary | ICD-10-CM | POA: Diagnosis not present

## 2018-09-17 DIAGNOSIS — F411 Generalized anxiety disorder: Secondary | ICD-10-CM

## 2018-09-17 DIAGNOSIS — R5383 Other fatigue: Secondary | ICD-10-CM

## 2018-09-17 MED ORDER — TRAZODONE HCL 100 MG PO TABS: 100.0000 mg | ORAL_TABLET | Freq: Every evening | ORAL | 1 refills | Status: DC | PRN

## 2018-09-17 MED FILL — traZODone HCL 100 MG TABS: 100 | 90 days supply | Qty: 90 | Fill #0

## 2018-09-17 NOTE — Progress Notes (Signed)
Virtual Visit  via Video or Phone Note  I connected with      Marc Artisthristopher Texidor on 09/17/18 at 3:00 by a telemedicine application and verified that I am speaking with the correct person using two identifiers.   I discussed the limitations of evaluation and management by telemedicine and the availability of in person appointments. The patient expressed understanding and agreed to proceed.  History of Present Illness: Marc Wiggins is a 43 y.o. male who would like to discuss anxiety and insomnia   Insomnia/Anxiety: Anxiety is better, even not on the Cymbalta. The Trazodone seems to be helping with sleep, he'd like refill on this, 50 mg helps some but 100 was better the few times he tried it. Taking as needed    Snoring, fatigue: Request for sleep study.  STOP-BANG for SLEEP APNEA Do you Snore loudly? Yes Do you often feel Tired during day? Yes Has anyone Observed you stop breathing? Yes History of high blood Pressure? No BMI >35? No Age >50? No Neck circumference >16 in? No Gender male? Yes 3-4 = intermediate   Varicose vein: Request for referral for vein specialist, has a varicose vein he'd like looked at, it's swollen and bothersome.         Observations/Objective: Wt 238 lb (108 kg)   BMI 32.28 kg/m  BP Readings from Last 3 Encounters:  06/29/18 135/75  06/15/18 123/79  02/24/18 123/76   Exam: Normal Speech.    Lab and Radiology Results No results found for this or any previous visit (from the past 72 hour(s)). No results found.     Assessment and Plan: 43 y.o. male with The primary encounter diagnosis was Insomnia due to mental condition. Diagnoses of Symptomatic varicose veins, right, Snoring, Fatigue, unspecified type, and Generalized anxiety disorder were also pertinent to this visit.  Orders Placed This Encounter  Procedures  . Ambulatory referral to Sleep Studies  . Ambulatory referral to Vascular Surgery    PDMP not reviewed this  encounter. No orders of the defined types were placed in this encounter.  Meds ordered this encounter  Medications  . traZODone (DESYREL) 100 MG tablet    Sig: Take 1 tablet (100 mg total) by mouth at bedtime as needed for sleep.    Dispense:  90 tablet    Refill:  1    Ok to mail to patient   There are no Patient Instructions on file for this visit.  Instructions sent via MyChart. If MyChart not available, pt was given option for info via personal e-mail w/ no guarantee of protected health info over unsecured e-mail communication, and MyChart sign-up instructions were included.   Follow Up Instructions: Return for annual in 11/2018.    I discussed the assessment and treatment plan with the patient. The patient was provided an opportunity to ask questions and all were answered. The patient agreed with the plan and demonstrated an understanding of the instructions.   The patient was advised to call back or seek an in-person evaluation if the symptoms worsen or if the condition fails to improve as anticipated.  I provided 20 minutes of non-face-to-face time during this encounter.                      Historical information moved to improve visibility of documentation.  No past medical history on file. Past Surgical History:  Procedure Laterality Date  . CHOLECYSTECTOMY     Social History   Tobacco Use  .  Smoking status: Never Smoker  . Smokeless tobacco: Never Used  Substance Use Topics  . Alcohol use: Never    Frequency: Never   family history includes Cancer in his paternal grandmother; Diabetes in his maternal aunt and maternal grandmother; Heart attack in his maternal grandmother; Hypertension in his father; Stroke in his maternal grandmother.  Medications: Current Outpatient Medications  Medication Sig Dispense Refill  . fexofenadine (ALLEGRA) 180 MG tablet Take 180 mg daily by mouth.    . Multiple Vitamin (MULTIVITAMIN) tablet Take 1 tablet daily by  mouth.    . traZODone (DESYREL) 100 MG tablet Take 1 tablet (100 mg total) by mouth at bedtime as needed for sleep. 90 tablet 1   No current facility-administered medications for this visit.    Allergies  Allergen Reactions  . Hydrocodone     PDMP not reviewed this encounter. No orders of the defined types were placed in this encounter.  Meds ordered this encounter  Medications  . traZODone (DESYREL) 100 MG tablet    Sig: Take 1 tablet (100 mg total) by mouth at bedtime as needed for sleep.    Dispense:  90 tablet    Refill:  1    Ok to mail to patient

## 2018-09-22 ENCOUNTER — Telehealth: Payer: Self-pay | Admitting: Osteopathic Medicine

## 2018-09-22 DIAGNOSIS — R0683 Snoring: Secondary | ICD-10-CM

## 2018-09-22 DIAGNOSIS — R5383 Other fatigue: Secondary | ICD-10-CM

## 2018-09-22 DIAGNOSIS — F5105 Insomnia due to other mental disorder: Secondary | ICD-10-CM

## 2018-09-22 NOTE — Telephone Encounter (Signed)
Needed ORDER for sleep study not REFERRAL for sleep study... If this doesn't work, they;ll need to tess [place] directly or take a verbal order, it all looks the same from my end on Epic.

## 2018-11-09 ENCOUNTER — Other Ambulatory Visit: Payer: Self-pay

## 2018-11-09 DIAGNOSIS — I83811 Varicose veins of right lower extremities with pain: Secondary | ICD-10-CM

## 2018-11-12 ENCOUNTER — Telehealth: Payer: Self-pay | Admitting: *Deleted

## 2018-11-13 ENCOUNTER — Ambulatory Visit (HOSPITAL_COMMUNITY)
Admission: RE | Admit: 2018-11-13 | Discharge: 2018-11-13 | Disposition: A | Discharge status: Home / Self Care | Payer: No Typology Code available for payment source | Source: Ambulatory Visit | Attending: Family | Admitting: Family

## 2018-11-13 ENCOUNTER — Encounter: Payer: No Typology Code available for payment source | Admitting: Vascular Surgery

## 2018-11-13 ENCOUNTER — Other Ambulatory Visit: Payer: Self-pay

## 2018-11-13 DIAGNOSIS — I83811 Varicose veins of right lower extremities with pain: Secondary | ICD-10-CM | POA: Insufficient documentation

## 2018-11-16 ENCOUNTER — Other Ambulatory Visit: Payer: Self-pay

## 2018-11-16 ENCOUNTER — Encounter: Payer: Self-pay | Admitting: Family

## 2018-11-16 ENCOUNTER — Ambulatory Visit (INDEPENDENT_AMBULATORY_CARE_PROVIDER_SITE_OTHER): Payer: No Typology Code available for payment source | Admitting: Family

## 2018-11-16 VITALS — Temp 97.8°F | Ht 72.0 in | Wt 235.0 lb

## 2018-11-16 DIAGNOSIS — I872 Venous insufficiency (chronic) (peripheral): Secondary | ICD-10-CM

## 2018-11-16 DIAGNOSIS — I83811 Varicose veins of right lower extremities with pain: Secondary | ICD-10-CM

## 2018-11-16 NOTE — Patient Instructions (Signed)
To decrease swelling in your feet and legs: Elevate feet above slightly bent knees, feet above heart, overnight and 3-4 times per day for 20 minutes.    Chronic Venous Insufficiency Chronic venous insufficiency, also called venous stasis, is a condition that prevents blood from being pumped effectively through the veins in your legs. Blood may no longer be pumped effectively from the legs back to the heart. This condition can range from mild to severe. With proper treatment, you should be able to continue with an active life. What are the causes? Chronic venous insufficiency occurs when the vein walls become stretched, weakened, or damaged, or when valves within the vein are damaged. Some common causes of this include:  High blood pressure inside the veins (venous hypertension).  Increased blood pressure in the leg veins from long periods of sitting or standing.  A blood clot that blocks blood flow in a vein (deep vein thrombosis, DVT).  Inflammation of a vein (phlebitis) that causes a blood clot to form.  Tumors in the pelvis that cause blood to back up. What increases the risk? The following factors may make you more likely to develop this condition:  Having a family history of this condition.  Obesity.  Pregnancy.  Living without enough physical activity or exercise (sedentary lifestyle).  Smoking.  Having a job that requires long periods of standing or sitting in one place.  Being a certain age. Women in their 40s and 50s and men in their 70s are more likely to develop this condition. What are the signs or symptoms? Symptoms of this condition include:  Veins that are enlarged, bulging, or twisted (varicose veins).  Skin breakdown or ulcers.  Reddened or discolored skin on the front of the leg.  Brown, smooth, tight, and painful skin just above the ankle, usually on the inside of the leg (lipodermatosclerosis).  Swelling. How is this diagnosed? This condition may  be diagnosed based on:  Your medical history.  A physical exam.  Tests, such as: ? A procedure that creates an image of a blood vessel and nearby organs and provides information about blood flow through the blood vessel (duplex ultrasound). ? A procedure that tests blood flow (plethysmography). ? A procedure to look at the veins using X-ray and dye (venogram). How is this treated? The goals of treatment are to help you return to an active life and to minimize pain or disability. Treatment depends on the severity of your condition, and it may include:  Wearing compression stockings. These can help relieve symptoms and help prevent your condition from getting worse. However, they do not cure the condition.  Sclerotherapy. This is a procedure involving an injection of a material that "dissolves" damaged veins.  Surgery. This may involve: ? Removing a diseased vein (vein stripping). ? Cutting off blood flow through the vein (laser ablation surgery). ? Repairing a valve. Follow these instructions at home:      Wear compression stockings as told by your health care provider. These stockings help to prevent blood clots and reduce swelling in your legs.  Take over-the-counter and prescription medicines only as told by your health care provider.  Stay active by exercising, walking, or doing different activities. Ask your health care provider what activities are safe for you and how much exercise you need.  Drink enough fluid to keep your urine clear or pale yellow.  Do not use any products that contain nicotine or tobacco, such as cigarettes and e-cigarettes. If you need help quitting,   ask your health care provider.  Keep all follow-up visits as told by your health care provider. This is important. Contact a health care provider if:  You have redness, swelling, or more pain in the affected area.  You see a red streak or line that extends up or down from the affected area.  You have  skin breakdown or a loss of skin in the affected area, even if the breakdown is small.  You get an injury in the affected area. Get help right away if:  You get an injury and an open wound in the affected area.  You have severe pain that does not get better with medicine.  You have sudden numbness or weakness in the foot or ankle below the affected area, or you have trouble moving your foot or ankle.  You have a fever and you have worse or persistent symptoms.  You have chest pain.  You have shortness of breath. Summary  Chronic venous insufficiency, also called venous stasis, is a condition that prevents blood from being pumped effectively through the veins in your legs.  Chronic venous insufficiency occurs when the vein walls become stretched, weakened, or damaged, or when valves within the vein are damaged.  Treatment for this condition depends on how severe your condition is, and it may involve wearing compression stockings or having a procedure.  Make sure you stay active by exercising, walking, or doing different activities. Ask your health care provider what activities are safe for you and how much exercise you need. This information is not intended to replace advice given to you by your health care provider. Make sure you discuss any questions you have with your health care provider. Document Released: 09/30/2006 Document Revised: 04/15/2016 Document Reviewed: 04/15/2016 Elsevier Interactive Patient Education  2019 [employer]   

## 2018-11-16 NOTE — Progress Notes (Signed)
Virtual Visit via Telephone Note  I connected with Marc Wiggins on 11/16/2018 using the [employer] by telephone and verified that I was speaking with the correct person using two identifiers. Patient was located at his home and accompanied by himself. I am located at the [facility] office.   The limitations of evaluation and management by telemedicine and the availability of in person appointments have been previously discussed with the patient and are documented in the patients chart. The patient expressed understanding and consented to proceed.  PCP: Sunnie NielsenAlexander, Natalie, DO  Chief Complaint: Right lower leg swelling and burning, Varicose Veins  History of Present Illness  Marc ArtistChristopher Wiggins is a 43 y.o. (07-05-1975) male who presents with chief complaint: progressively worsening right leg swelling and burning for years, more so in the last year.   He states that he works in a [employer], is on his feet 8 hours/day. His left leg feels fine, right leg swells the longer he is on his feet. He reports occasional calf hot feeling where the veins are bulging. This has worsened over the years, more noticeably in the last year. He denies any interventions on his veins.  When he jogs his right leg bothers him. He has been wearing knee high compression hose for the last month, states it helps a little.  He denies non healing wounds in his lower extremities.   He denies any breathing problems, denies any known cardiac problems, denies fever or chills.   No past medical history on file.  Social History Social History   Tobacco Use  . Smoking status: Never Smoker  . Smokeless tobacco: Never Used  Substance Use Topics  . Alcohol use: Never    Frequency: Never  . Drug use: Never    Family History Family History  Problem Relation Age of Onset  . Hypertension Father   . Diabetes Maternal Aunt   . Stroke Maternal Grandmother   . Diabetes Maternal Grandmother   . Heart attack Maternal  Grandmother   . Cancer Paternal Grandmother     Surgical History Past Surgical History:  Procedure Laterality Date  . CHOLECYSTECTOMY      Allergies  Allergen Reactions  . Hydrocodone     Current Outpatient Medications  Medication Sig Dispense Refill  . fexofenadine (ALLEGRA) 180 MG tablet Take 180 mg daily by mouth.    . Multiple Vitamin (MULTIVITAMIN) tablet Take 1 tablet daily by mouth.    . traZODone (DESYREL) 100 MG tablet Take 1 tablet (100 mg total) by mouth at bedtime as needed for sleep. 90 tablet 1   No current facility-administered medications for this visit.     REVIEW OF SYSTEMS: Cardiovascular: No chest pain, chest pressure, palpitations, orthopnea, or dyspnea on exertion. No claudication or rest pain,  No history of DVT or phlebitis. Pulmonary: No productive cough, asthma or wheezing. Neurologic: No weakness, paresthesias, aphasia, or amaurosis. No dizziness. Hematologic: No bleeding problems or clotting disorders. Musculoskeletal: No joint pain or joint swelling. Gastrointestinal: No blood in stool or hematemesis Genitourinary: No dysuria or hematuria. Psychiatric:: No history of major depression. Integumentary: No rashes or ulcers. Constitutional: No fever or chills.   DATA  Right LE Venous Duplex (Date: 11/16/2018):  Left Technical Findings: Left leg not evaluated.   Venous Reflux Times Normal value < 0.5 sec +------------------------------+----------+---------+  Right (ms)Left (ms) +------------------------------+----------+---------+ CFV                           4306.00             +------------------------------+----------+---------+ FV                            4482.00             +------------------------------+----------+---------+ Popliteal                     1555.00             +------------------------------+----------+---------+ GSV at Saphenofemoral junction5912.00              +------------------------------+----------+---------+ GSV prox thigh                5978.00             +------------------------------+----------+---------+ GSV mid thigh                 5105.00             +------------------------------+----------+---------+ GSV dist thigh                4474.00             +------------------------------+----------+---------+ GSV at knee                   3799.00             +------------------------------+----------+---------+ GSV prox calf                 5120.00             +------------------------------+----------+---------+ GSV mid calf                  1562.00             +------------------------------+----------+---------+ SSV mid                       814.00              +------------------------------+----------+---------+  +------------------------------+----------+---------+ VEIN DIAMETERS:               Right (cm)Left (cm) +------------------------------+----------+---------+ GSV at Saphenofemoral junction1.68                +------------------------------+----------+---------+ GSV at prox thigh             0.96                +------------------------------+----------+---------+ GSV at mid thigh              0.92                +------------------------------+----------+---------+ GSV at distal thigh           0.98                +------------------------------+----------+---------+ GSV at knee                   0.85                +------------------------------+----------+---------+ GSV prox calf                 0.79                +------------------------------+----------+---------+ GSV mid calf  0.62                +------------------------------+----------+---------+ SSV origin                    0.36                +------------------------------+----------+---------+ SSV prox                      0.32                 +------------------------------+----------+---------+ SSV mid                       0.45                +------------------------------+----------+---------+ Varicose Vein                 1.22                +------------------------------+----------+---------+   Right Reflux Technical Findings: Abnormal dilatation noted of the right popliteal vein, measured 2.63 cm at its largest diameter. Clusters of large varicosities noted in the posterior-medial calf appear to be coming from the great saphenous vein.   Summary: Right: Abnormal reflux times were noted in the common femoral vein, femoral vein in the thigh, popliteal vein, great saphenous vein at the saphenofemoral junction, great saphenous vein at the proximal thigh, great saphenous vein at the mid thigh, great  saphenous vein at the distal thigh, great saphenous vein at the knee, great saphenous vein at the prox calf, great saphenous vein at the mid calf, and mid small saphenous vein. There is no evidence of superficial venous thrombosis. No evidence of deep  vein thrombosis in the common femoral, femoral, or popliteal veins.   Medical Decision Making  Marc Wiggins is a 43 y.o. male who presents with: right leg swelling and burning sensation  for several years, worsening over time, burning and swelling most noticeable in the last year. Today's right lower extremity venous reflux study demonstrates significant reflux of the greater saphenous vein  Based on the patient's vascular studies and HPI, I have offered the patient: 3 months trial of thigh high compression hose, elevation of his legs above his heart overnight and 4x/day for 20 minutes if possible.   I discussed with the patient the use of her 20-30 mm thigh high compression stockings and need for 3 month trial of such.  After a 3 months, he will follow up with my partners for evaluation in the [facility]. I discussed the assessment and treatment plan with the  patient. The patient was provided an opportunity to ask questions and all were answered. The patient agreed with the plan and demonstrated an understanding of the instructions.   The patient was advised to call back or seek an in-person evaluation if the symptoms worsen or if the condition fails to improve as anticipated.  I spent 15 minutes with the patient via telephone encounter.   Thank you for allowing [place] to participate in this patient's care.   Gabrielle Dare Marc Wiggins [facility] Office: [phone]  11/16/2018, 9:05 AM

## 2018-11-17 ENCOUNTER — Ambulatory Visit (INDEPENDENT_AMBULATORY_CARE_PROVIDER_SITE_OTHER): Payer: No Typology Code available for payment source | Admitting: Osteopathic Medicine

## 2018-11-17 ENCOUNTER — Encounter: Payer: Self-pay | Admitting: Osteopathic Medicine

## 2018-11-17 VITALS — BP 118/71 | HR 77 | Temp 98.2°F | Wt 244.1 lb

## 2018-11-17 DIAGNOSIS — Z23 Encounter for immunization: Secondary | ICD-10-CM | POA: Diagnosis not present

## 2018-11-17 DIAGNOSIS — R21 Rash and other nonspecific skin eruption: Secondary | ICD-10-CM

## 2018-11-17 DIAGNOSIS — Z0000 Encounter for general adult medical examination without abnormal findings: Secondary | ICD-10-CM

## 2018-11-17 MED ORDER — TRAZODONE HCL 100 MG PO TABS: 100.0000 mg | ORAL_TABLET | Freq: Every evening | ORAL | 1 refills | Status: DC | PRN

## 2018-11-17 MED ORDER — BETAMETHASONE DIPROPIONATE 0.05 % EX CREA: TOPICAL_CREAM | Freq: Two times a day (BID) | CUTANEOUS | 1 refills | Status: DC

## 2018-11-17 MED FILL — BETAMETHASONE DIPROPIONATE: 0.05 | 14 days supply | Qty: 45 | Fill #0

## 2018-11-17 NOTE — Progress Notes (Signed)
HPI: Marc ArtistChristopher Wiggins is a 43 y.o. male who  has no past medical history on file.  [facility] today, 11/17/18,  for chief complaint of: Annual physical     Patient here for annual physical / wellness exam.  See preventive care reviewed as below.  Recent labs reviewed in detail with the patient.   Additional concerns today include:  Skin spot on back he'd like looked at, itchy     Past medical, surgical, social and family history reviewed:  Patient Active Problem List   Diagnosis Date Noted  . Current moderate episode of major depressive disorder without prior episode (HCC) 06/29/2018  . 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 Generalized anxiety disorder 04/23/2017    Past Surgical History:  Procedure Laterality Date  . CHOLECYSTECTOMY      Social History   Tobacco Use  . Smoking status: Never Smoker  . Smokeless tobacco: Never Used  Substance Use Topics  . Alcohol use: Never    Frequency: Never    Family History  Problem Relation Age of Onset  . Hypertension Father   . Diabetes Maternal Aunt   . Stroke Maternal Grandmother   . Diabetes Maternal Grandmother   . Heart attack Maternal Grandmother   . Cancer Paternal Grandmother      Current medication list and allergy/intolerance information reviewed:    Current Outpatient Medications  Medication Sig Dispense Refill  . fexofenadine (ALLEGRA) 180 MG tablet Take 180 mg daily by mouth.    . Multiple Vitamin (MULTIVITAMIN) tablet Take 1 tablet daily by mouth.    . traZODone (DESYREL) 100 MG tablet Take 1 tablet (100 mg total) by mouth at bedtime as needed for sleep. 90 tablet 1   No current facility-administered medications for this visit.     Allergies  Allergen Reactions  . Hydrocodone       Review of Systems:  Constitutional:  No  fever, no chills, No recent illness, No unintentional weight changes. No significant fatigue.   HEENT: No   headache, no vision change, no hearing change, No sore throat, No  sinus pressure  Cardiac: No  chest pain, No  pressure, No palpitations, No  Orthopnea  Respiratory:  No  shortness of breath. No  Cough  Gastrointestinal: No  abdominal pain, No  nausea, No  vomiting,  No  blood in stool, No  diarrhea, No  constipation   Musculoskeletal: No new myalgia/arthralgia  Skin: No  Rash, No other wounds/concerning lesions  Genitourinary: No  incontinence, No  abnormal genital bleeding, No abnormal genital discharge  Hem/Onc: No  easy bruising/bleeding, No  abnormal lymph node  Endocrine: No cold intolerance,  No heat intolerance. No polyuria/polydipsia/polyphagia   Neurologic: No  weakness, No  dizziness, No  slurred speech/focal weakness/facial droop  Psychiatric: No  concerns with depression, No  concerns with anxiety, No sleep problems, No mood problems  Exam:  BP 118/71 (BP Location: Left Arm, Patient Position: Sitting, Cuff Size: Normal)   Pulse 77   Temp 98.2 F (36.8 C) (Oral)   Wt 244 lb 1.6 oz (110.7 kg)   BMI 33.11 kg/m   Constitutional: VS see above. General Appearance: alert, well-developed, well-nourished, NAD  Eyes: Normal lids and conjunctive, non-icteric sclera  Ears, Nose, Mouth, Throat: MMM, Normal external inspection ears/nares/mouth/lips/gums. TM normal bilaterally. Pharynx/tonsils no erythema, no exudate. Nasal mucosa normal.   Neck: No masses, trachea midline. No thyroid enlargement. No tenderness/mass appreciated. No lymphadenopathy  Respiratory: Normal respiratory effort. no wheeze, no rhonchi, no rales  Cardiovascular: S1/S2 normal, no murmur, no rub/gallop auscultated. RRR. No lower extremity edema. Pedal pulse II/IV bilaterally DP and PT. No carotid bruit or JVD. No abdominal aortic bruit.  Gastrointestinal: Nontender, no masses. No hepatomegaly, no splenomegaly. No hernia appreciated. Bowel sounds normal. Rectal exam deferred.   Musculoskeletal: Gait  normal. No clubbing/cyanosis of digits.   Neurological: Normal balance/coordination. No tremor. No cranial nerve deficit on limited exam. Motor and sensation intact and symmetric. Cerebellar reflexes intact.   Skin: warm, dry, intact.  In center of back there is a patch of very slight hyperpigmentation with an area at about 1:00 that shows some eczematous features with scaling and excoriation.  There is a normal appearing nevus at about the 7 o'clock position  Psychiatric: Normal judgment/insight. Normal mood and affect. Oriented x3.    No results found for this or any previous visit (from the past 72 hour(s)).    ASSESSMENT/PLAN: The primary encounter diagnosis was Annual physical exam. A diagnosis of Rash and nonspecific skin eruption was also pertinent to this visit.  Rash appears benign, eczematous, possible hyperpigmentation d/t scratching/excoriation, pt advised to monitor it and if doesn't resolve w/ steroids let me know and can recheck / refer to dermatology   Orders Placed This Encounter  Procedures  . CBC  . COMPLETE METABOLIC PANEL WITH GFR  . Lipid panel    Meds ordered this encounter  Medications  . traZODone (DESYREL) 100 MG tablet    Sig: Take 1 tablet (100 mg total) by mouth at bedtime as needed for sleep.    Dispense:  90 tablet    Refill:  1    Ok to mail to patient  . betamethasone dipropionate (DIPROLENE) 0.05 % cream    Sig: Apply topically 2 (two) times daily. To affected area(s) for 1-2 weeks    Dispense:  45 g    Refill:  1    Patient Instructions  General Preventive Care  Most recent routine screening lipids/other labs: ordered today   Everyone should have blood pressure checked once per year.   Tobacco: don't!   Alcohol: responsible moderation is ok for most adults - if you have concerns about your alcohol intake, please talk to me!   Exercise: as tolerated to reduce risk of cardiovascular disease and diabetes. Strength training will also  prevent osteoporosis.   Mental health: if need for mental health care (medicines, counseling, other), or concerns about moods, please let me know!   Sexual health: if need for STD testing, or if concerns with libido/pain problems, please let me know!   Advanced Directive: Living Will and/or Healthcare Power of Attorney recommended for all adults, regardless of age or health.  Vaccines  Flu vaccine: recommended for almost everyone, every fall.   Shingles vaccine: Shingrix recommended after age 41.   Pneumonia vaccines: Prevnar and Pneumovax recommended after age 41.  Tetanus booster: Tdap recommended every 10 years.  Cancer screenings   Colon cancer screening: recommended for everyone at age 41, but some folks need a colonoscopy sooner if risk factors   Prostate cancer screening: PSA blood test around age 41  Lung cancer screening: not needed for non-smokers  Infection screenings . HIV, Gonorrhea/Chlamydia: screening as needed . Hepatitis C: recommended for anyone born [id number] . TB: certain at-risk populations, or depending on work requirements and/or travel history Other . Bone Density Test: recommended for men at age 41, sooner depending on risk factors .  Abdominal Aortic Aneurysm: screening with ultrasound recommended once for men age 52-75 who have ever smoked        Visit summary with medication list and pertinent instructions was printed for patient to review. All questions at time of visit were answered - patient instructed to contact office with any additional concerns or updates. ER/RTC precautions were reviewed with the patient.     Please note: voice recognition software was used to produce this document, and typos may escape review. Please contact Dr. Sheppard Coil for any needed clarifications.     Follow-up plan: Return in about 1 year (around 11/17/2019) for annual physical, sooner if needed / based on lab results .

## 2018-11-17 NOTE — Patient Instructions (Addendum)
General Preventive Care  Most recent routine screening lipids/other labs: ordered today   Everyone should have blood pressure checked once per year.   Tobacco: don't!   Alcohol: responsible moderation is ok for most adults - if you have concerns about your alcohol intake, please talk to me!   Exercise: as tolerated to reduce risk of cardiovascular disease and diabetes. Strength training will also prevent osteoporosis.   Mental health: if need for mental health care (medicines, counseling, other), or concerns about moods, please let me know!   Sexual health: if need for STD testing, or if concerns with libido/pain problems, please let me know!   Advanced Directive: Living Will and/or Healthcare Power of Attorney recommended for all adults, regardless of age or health.  Vaccines  Flu vaccine: recommended for almost everyone, every fall.   Shingles vaccine: Shingrix recommended after age 64.   Pneumonia vaccines: Prevnar and Pneumovax recommended after age 100.  Tetanus booster: Tdap recommended every 10 years.  Cancer screenings   Colon cancer screening: recommended for everyone at age 10, but some folks need a colonoscopy sooner if risk factors   Prostate cancer screening: PSA blood test around age 29  Lung cancer screening: not needed for non-smokers  Infection screenings . HIV, Gonorrhea/Chlamydia: screening as needed . Hepatitis C: recommended for anyone born 69-1963 . TB: certain at-risk populations, or depending on work requirements and/or travel history Other . Bone Density Test: recommended for men at age 8, sooner depending on risk factors . Abdominal Aortic Aneurysm: screening with ultrasound recommended once for men age 78-75 who have ever smoked

## 2018-11-18 LAB — COMPLETE METABOLIC PANEL WITH GFR
AG Ratio: 1.9 (calc) (ref 1.0–2.5)
ALT: 20 U/L (ref 9–46)
AST: 20 U/L (ref 10–40)
Albumin: 4.3 g/dL (ref 3.6–5.1)
Alkaline phosphatase (APISO): 79 U/L (ref 36–130)
BUN/Creatinine Ratio: 24 (calc) — ABNORMAL HIGH (ref 6–22)
BUN: 27 mg/dL — ABNORMAL HIGH (ref 7–25)
CO2: 26 mmol/L (ref 20–32)
Calcium: 9.2 mg/dL (ref 8.6–10.3)
Chloride: 104 mmol/L (ref 98–110)
Creat: 1.13 mg/dL (ref 0.60–1.35)
GFR, Est African American: 93 mL/min/{1.73_m2} (ref >=60)
GFR, Est Non African American: 80 mL/min/{1.73_m2} (ref >=60)
Globulin: 2.3 g/dL (calc) (ref 1.9–3.7)
Glucose, Bld: 98 mg/dL (ref 65–99)
Potassium: 4.6 mmol/L (ref 3.5–5.3)
Sodium: 139 mmol/L (ref 135–146)
Total Bilirubin: 0.6 mg/dL (ref 0.2–1.2)
Total Protein: 6.6 g/dL (ref 6.1–8.1)

## 2018-11-18 LAB — LIPID PANEL
Cholesterol: 211 mg/dL — ABNORMAL HIGH (ref <200)
HDL: 82 mg/dL (ref >=40)
LDL Cholesterol (Calc): 115 mg/dL (calc) — ABNORMAL HIGH
Non-HDL Cholesterol (Calc): 129 mg/dL (calc) (ref <130)
Total CHOL/HDL Ratio: 2.6 (calc) (ref <5.0)
Triglycerides: 48 mg/dL (ref <150)

## 2018-11-18 LAB — CBC
HCT: 47 % (ref 38.5–50.0)
Hemoglobin: 16.2 g/dL (ref 13.2–17.1)
MCH: 32 pg (ref 27.0–33.0)
MCHC: 34.5 g/dL (ref 32.0–36.0)
MCV: 92.7 fL (ref 80.0–100.0)
MPV: 10.6 fL (ref 7.5–12.5)
Platelets: 221 10*3/uL (ref 140–400)
RBC: 5.07 10*6/uL (ref 4.20–5.80)
RDW: 11.9 % (ref 11.0–15.0)
WBC: 5.5 10*3/uL (ref 3.8–10.8)

## 2018-12-14 MED FILL — traZODone HCL 100 MG TABS: 100 | 90 days supply | Qty: 90 | Fill #1

## 2019-02-25 ENCOUNTER — Encounter: Payer: Self-pay | Admitting: Vascular Surgery

## 2019-02-25 ENCOUNTER — Other Ambulatory Visit: Payer: Self-pay

## 2019-02-25 ENCOUNTER — Ambulatory Visit (INDEPENDENT_AMBULATORY_CARE_PROVIDER_SITE_OTHER): Payer: No Typology Code available for payment source | Admitting: Vascular Surgery

## 2019-02-25 VITALS — BP 107/71 | HR 80 | Temp 98.2°F | Resp 16 | Ht 71.0 in | Wt 246.8 lb

## 2019-02-25 DIAGNOSIS — I83811 Varicose veins of right lower extremities with pain: Secondary | ICD-10-CM

## 2019-02-25 NOTE — Progress Notes (Signed)
Patient name: Marc Wiggins MRN: [id number] DOB: Jun 09, 1976 Sex: male  REASON FOR VISIT:   Follow-up of painful varicose veins of the right lower extremity  HPI:   Marc Wiggins is a pleasant 43 y.o. male who had a virtual visit with our nurse practitioner on 11/16/2018.  He was complaining of significant worsening right leg pain and swelling which is been going on for the last year.  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  The swelling progresses during the day.  He developed some varicose veins in the right leg which is gotten progressively worse over the years.  He has not had any previous interventions on them veins.  He had been wearing knee-high compression stockings for the last month which which helped some.  He was put in thigh-high compression stockings with a gradient of 20 to 30 mmHg.  He was encouraged to elevate his legs as much as possible and take ibuprofen as needed for pain.  He comes in for a 24-month follow-up visit.  Since he was seen last, he has been wearing his thigh-high compression stockings and knees do help with his symptoms some.  He also continues to elevate his legs and take ibuprofen as needed for pain.  He does continue to have significant aching pain in his legs especially when he is exercising or at work for 8 hours.  He has had some moderate leg swelling especially on the right side.  No past medical history on file.  Family History  Problem Relation Age of Onset  . Hypertension Father   . Diabetes Maternal Aunt   . Stroke Maternal Grandmother   . Diabetes Maternal Grandmother   . Heart attack Maternal Grandmother   . Cancer Paternal Grandmother     SOCIAL HISTORY: Social History   Tobacco Use  . Smoking status: Never Smoker  . Smokeless tobacco: Never Used  Substance Use Topics  . Alcohol use: Never    Frequency: Never    Allergies  Allergen Reactions  . Hydrocodone     Current Outpatient Medications   Medication Sig Dispense Refill  . betamethasone dipropionate (DIPROLENE) 0.05 % cream Apply topically 2 (two) times daily. To affected area(s) for 1-2 weeks 45 g 1  . fexofenadine (ALLEGRA) 180 MG tablet Take 180 mg daily by mouth.    . Multiple Vitamin (MULTIVITAMIN) tablet Take 1 tablet daily by mouth.    . traZODone (DESYREL) 100 MG tablet Take 1 tablet (100 mg total) by mouth at bedtime as needed for sleep. 90 tablet 1   No current facility-administered medications for this visit.     REVIEW OF SYSTEMS:  [X]  denotes positive finding, [ ]  denotes negative finding Cardiac  Comments:  Chest pain or chest pressure:    Shortness of breath upon exertion:    Short of breath when lying flat:    Irregular heart rhythm:        Vascular    Pain in calf, thigh, or hip brought on by ambulation:    Pain in feet at night that wakes you up from your sleep:     Blood clot in your veins:    Leg swelling:  x       Pulmonary    Oxygen at home:    Productive cough:     Wheezing:         Neurologic    Sudden weakness in arms or legs:  Sudden numbness in arms or legs:     Sudden onset of difficulty speaking or slurred speech:    Temporary loss of vision in one eye:     Problems with dizziness:         Gastrointestinal    Blood in stool:     Vomited blood:         Genitourinary    Burning when urinating:     Blood in urine:        Psychiatric    Major depression:         Hematologic    Bleeding problems:    Problems with blood clotting too easily:        Skin    Rashes or ulcers:        Constitutional    Fever or chills:     PHYSICAL EXAM:   There were no vitals filed for this visit.  GENERAL: The patient is a well-nourished male, in no acute distress. The vital signs are documented above. CARDIAC: There is a regular rate and rhythm.  VASCULAR: I do not detect carotid bruits. He has palpable posterior tibial pulses bilaterally. I looked at his right great saphenous  vein myself with the SonoSite and this is markedly dilated with reflux from the saphenofemoral junction to the proximal calf.  I think we could cannulate the vein at the level of the knee.  He also has a large cluster of varicose veins in the posterior right calf as documented below.  RIGHT LEG:      PULMONARY: There is good air exchange bilaterally without wheezing or rales. ABDOMEN: Soft and non-tender with normal pitched bowel sounds.  MUSCULOSKELETAL: There are no major deformities or cyanosis. NEUROLOGIC: No focal weakness or paresthesias are detected. SKIN: There are no ulcers or rashes noted. PSYCHIATRIC: The patient has a normal affect.  DATA:    VENOUS DUPLEX: I reviewed his previous venous duplex scan that was done on 11/13/2018.  This was of the right lower extremity only.  He had no evidence of DVT.  He had deep venous reflux involving the common femoral vein femoral vein and popliteal vein.  He had superficial venous reflux in the great saphenous vein from the saphenofemoral junction to the mid calf.  The great saphenous vein is significantly dilated with diameters ranging from 0.79 in the proximal calf to 0.96 in the proximal thigh.  MEDICAL ISSUES:   CHRONIC VENOUS INSUFFICIENCY: This patient has significant deep and superficial venous reflux on the right.  He is tried conservative treatment including thigh-high compression stockings with a gradient of 20-30, leg elevation, and ibuprofen as needed for pain.  He continues to have significant symptoms and for this reason I think he would be a good candidate for laser ablation of the right great saphenous vein from the knee to the saphenofemoral junction and 10-20 stab phlebectomies.  I have discussed the indications for endovenous laser ablation of the right GSV, that is to lower the pressure in the veins and potentially help relieve the symptoms from venous hypertension. I have also discussed alternative options including  conservative treatment with leg elevation, compression therapy, exercise, avoiding prolonged sitting and standing, and weight management. I have discussed the potential complications of the procedure, including, but not limited to: bleeding, bruising, leg swelling, nerve injury, skin burns, significant pain from phlebitis, deep venous thrombosis, or failure of the vein to close.  I have also explained that venous insufficiency is a chronic disease, and that  the patient is at risk for recurrent varicose veins in the future.  All of the patient's questions were encouraged and answered. They are agreeable to proceed.   I have discussed with the patient the indications for stab phlebectomy.  I have explained to the patient that that will have small scars from the stab incisions.  I explained that the other risks include leg swelling, bruising, bleeding, and phlebitis.  All the patient's questions were encouraged and answered and they are agreeable to proceed.   Waverly Ferrarihristopher Dickson [facility] [phone]

## 2019-03-18 ENCOUNTER — Other Ambulatory Visit: Payer: Self-pay | Admitting: *Deleted

## 2019-03-18 DIAGNOSIS — I83811 Varicose veins of right lower extremities with pain: Secondary | ICD-10-CM

## 2019-03-25 MED FILL — traZODone HCL 100 MG TABS: 100 | 90 days supply | Qty: 90 | Fill #0

## 2019-03-26 ENCOUNTER — Ambulatory Visit (INDEPENDENT_AMBULATORY_CARE_PROVIDER_SITE_OTHER): Payer: No Typology Code available for payment source | Admitting: Physician Assistant

## 2019-03-26 DIAGNOSIS — Z23 Encounter for immunization: Secondary | ICD-10-CM | POA: Diagnosis not present

## 2019-03-30 ENCOUNTER — Encounter: Payer: Self-pay | Admitting: Emergency Medicine

## 2019-03-30 ENCOUNTER — Other Ambulatory Visit: Payer: Self-pay

## 2019-03-30 ENCOUNTER — Emergency Department
Admission: EM | Admit: 2019-03-30 | Discharge: 2019-03-30 | Disposition: A | Discharge status: Home / Self Care | Payer: No Typology Code available for payment source | Source: Home / Self Care | Attending: Emergency Medicine | Admitting: Emergency Medicine

## 2019-03-30 DIAGNOSIS — R112 Nausea with vomiting, unspecified: Secondary | ICD-10-CM

## 2019-03-30 DIAGNOSIS — R197 Diarrhea, unspecified: Secondary | ICD-10-CM | POA: Diagnosis not present

## 2019-03-30 MED ORDER — HYOSCYAMINE SULFATE 0.125 MG PO TABS: ORAL_TABLET | ORAL | 0 refills | Status: DC

## 2019-03-30 MED ORDER — LOPERAMIDE HCL 2 MG PO CAPS: ORAL_CAPSULE | ORAL | 0 refills | Status: DC

## 2019-03-30 MED ORDER — CIPROFLOXACIN HCL 500 MG PO TABS: 500.0000 mg | ORAL_TABLET | Freq: Two times a day (BID) | ORAL | 0 refills | Status: AC

## 2019-03-30 MED FILL — ANTI-DIARRHEAL 2 MG TABS: 2 | 8 days supply | Qty: 24 | Fill #0

## 2019-03-30 MED FILL — HYOSCYAMINE SULF 0.125 MG T: 0.125 | 3 days supply | Qty: 15 | Fill #0

## 2019-03-30 MED FILL — CIPROFLOXACIN HCL 500 MG TA: 500 | 5 days supply | Qty: 10 | Fill #0

## 2019-03-30 NOTE — ED Notes (Signed)
Pt left facility at 1134. Discharge will be backtimed

## 2019-03-30 NOTE — ED Provider Notes (Addendum)
[name]    CSN: [id number] Arrival date & time: 03/30/19  1008      History   Chief Complaint Chief Complaint  Patient presents with  . Diarrhea    HPI Marc Wiggins is a 43 y.o. male.   HPI 3 days ago, started with vague epigastric pain, nausea vomited nonbloody vomit .  Then, the following day, pt states he started having diarrhea.  About 4 loose watery stools daily without blood or mucus.  The vomiting has resolved and nausea eventually resolved and although he has decreased appetite, he still tolerating solids and liquids by mouth.  still having diarrhea multiple times daily.  He is not sure how many times, but perhaps 4 times a day.  Denies abdominal pain but states he has a mild, vague, burning sensation in his mid abdominal area, but denies epigastric burning or pain.  Denies specific right lower quadrant or right upper quadrant pain. He may have had a low-grade fever, undocumented.  Denies chills, sweats or rash. No chest pain or shortness of breath or cough.  No ENT symptoms.  No change in taste or smell.  No one else is been around has similar symptoms.   Denies exposure to anyone with Covid. Denies any GU symptoms. He is status post cholecystectomy many years ago.  Patient Active Problem List   Diagnosis Date Noted  . Current moderate episode of major depressive disorder without prior episode (HCC) 06/29/2018  . 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 Generalized anxiety disorder 04/23/2017   Reviewing EMR, CBC and CMP were within normal limits June 2020 Past Surgical History:  Procedure Laterality Date  . CHOLECYSTECTOMY         Home Medications    Prior to Admission medications   Medication Sig Start Date End Date Taking? Authorizing Provider  ciprofloxacin (CIPRO) 500 MG tablet Take 1 tablet (500 mg total) by mouth 2 (two) times daily for 5 days. For 5 days 03/30/19 04/04/19  Lajean Manes, MD  fexofenadine (ALLEGRA) 180 MG  tablet Take 180 mg daily by mouth.    [provider]  hyoscyamine (LEVSIN) 0.125 MG tablet Take one by mouth every 4-6 hours as needed for abdominal cramping or pain 03/30/19   Lajean Manes, MD  loperamide (IMODIUM) 2 MG capsule Take 1 by mouth every 8 hours if needed for diarrhea.-Do not take if diarrhea is getting better 03/30/19   Lajean Manes, MD  Multiple Vitamin (MULTIVITAMIN) tablet Take 1 tablet daily by mouth.    [provider]  traZODone (DESYREL) 100 MG tablet Take 1 tablet (100 mg total) by mouth at bedtime as needed for sleep. 11/17/18   [name], DO    Family History Family History  Problem Relation Age of Onset  . Hypertension Father   . Diabetes Maternal Aunt   . Stroke Maternal Grandmother   . Diabetes Maternal Grandmother   . Heart attack Maternal Grandmother   . Cancer Paternal Grandmother     Social History Social History   Tobacco Use  . Smoking status: Never Smoker  . Smokeless tobacco: Never Used  Substance Use Topics  . Alcohol use: Never    Frequency: Never  . Drug use: Never     Allergies   Hydrocodone   Review of Systems Review of Systems  All other systems reviewed and are negative.    Physical Exam Triage Vital Signs ED Triage Vitals [03/30/19 1101]  Enc Vitals Group     BP  Pulse      Resp      Temp      Temp src      SpO2      Weight      Height      Head Circumference      Peak Flow      Pain Score 0     Pain Loc      Pain Edu?      Excl. in GC?    No data found.  Updated Vital Signs Temp 98.5.  Pulse 80, regular.  Respirations 16.  BP 120/79   Physical Exam Vitals signs and nursing note reviewed.  Constitutional:      General: He is not in acute distress.    Appearance: He is well-developed.  HENT:     Right Ear: External ear normal.     Left Ear: External ear normal.     Nose: Nose normal.     Mouth/Throat:     Pharynx: No oropharyngeal exudate.  Eyes:     General: No  scleral icterus.       Right eye: No discharge.        Left eye: No discharge.     Conjunctiva/sclera: Conjunctivae normal.  Neck:     Musculoskeletal: Normal range of motion and neck supple.  Cardiovascular:     Rate and Rhythm: Normal rate and regular rhythm.     Heart sounds: Normal heart sounds.  Pulmonary:     Breath sounds: Normal breath sounds.  Abdominal:     General: Bowel sounds are increased. There is no abdominal bruit.     Palpations: Abdomen is soft. Abdomen is not rigid. There is no pulsatile mass.     Tenderness: There is no guarding or rebound. Negative signs include Murphy's sign and McBurney's sign.     Comments: Abdomen soft.  No mass or hepatosplenomegaly.   There is minimal periumbilical tenderness.  No masses guarding or rebound.  No other tenderness. No CVA tenderness.  Musculoskeletal: Normal range of motion.  Skin:    General: Skin is warm and dry.     Findings: No rash.  Neurological:     Mental Status: He is alert and oriented to person, place, and time.     Cranial Nerves: No cranial nerve deficit.      UC Treatments / Results  Labs (all labs ordered are listed, but only abnormal results are displayed) Labs Reviewed  NOVEL CORONAVIRUS, NAA    EKG   Radiology No results found.  Procedures Procedures (including critical care time)  Medications Ordered in UC Medications - No data to display  Initial Impression / Assessment and Plan / UC Course  I have reviewed the triage vital signs and the nursing notes.  Pertinent labs & imaging results that were available during my care of the patient were reviewed by me and considered in my medical decision making (see chart for details).      Final Clinical Impressions(s) / UC Diagnoses   Final diagnoses:  Diarrhea of presumed infectious origin     Discharge Instructions     Please read attached instruction sheet on diarrhea. By history and physical exam, diarrhea might be caused by  bacterial infection.-I have sent prescription for an antibiotic called Cipro to your pharmacy. Also, prescription sent for hyoscyamine, to help with abdominal cramps.  May sparingly take Imodium as needed for diarrhea, but do not take Imodium if diarrhea is getting better. Rest and drink plenty  of fluids.  Stay away from dairy products. It is less likely that this could be Covid, but to be on the safe side, we are sending off a Covid test.  You need to quarantine until Covid test comes back.    ED Prescriptions    Medication Sig Dispense Auth. Provider   ciprofloxacin (CIPRO) 500 MG tablet Take 1 tablet (500 mg total) by mouth 2 (two) times daily for 5 days. For 5 days 10 tablet Jacqulyn Cane, MD   hyoscyamine (LEVSIN) 0.125 MG tablet Take one by mouth every 4-6 hours as needed for abdominal cramping or pain 15 tablet Jacqulyn Cane, MD   loperamide (IMODIUM) 2 MG capsule Take 1 by mouth every 8 hours if needed for diarrhea.-Do not take if diarrhea is getting better 6 capsule Jacqulyn Cane, MD     Follow-up with your primary care doctor in 5-7 days if not improving, or sooner if symptoms become worse. Precautions discussed. Red flags discussed. Questions invited and answered. Patient voiced understanding and agreement.    Jacqulyn Cane, MD 03/31/19 [facility]    Jacqulyn Cane, MD 03/31/19 [phone]

## 2019-03-30 NOTE — Discharge Instructions (Addendum)
Please read attached instruction sheet on diarrhea. By history and physical exam, diarrhea might be caused by bacterial infection.-I have sent prescription for an antibiotic called Cipro to your pharmacy. Also, prescription sent for hyoscyamine, to help with abdominal cramps.  May sparingly take Imodium as needed for diarrhea, but do not take Imodium if diarrhea is getting better. Rest and drink plenty of fluids.  Stay away from dairy products. It is less likely that this could be Covid, but to be on the safe side, we are sending off a Covid test.  You need to quarantine until Covid test comes back.

## 2019-03-30 NOTE — ED Triage Notes (Signed)
Pt states he started having diarrhea on Saturday. Sunday vomiting. Still having diarrhea multiple times daily. Denies abdominal pain but states he has a burning sensation in his stomach.

## 2019-03-31 ENCOUNTER — Encounter: Payer: Self-pay | Admitting: Emergency Medicine

## 2019-04-01 ENCOUNTER — Other Ambulatory Visit: Payer: No Typology Code available for payment source | Admitting: Vascular Surgery

## 2019-04-01 LAB — NOVEL CORONAVIRUS, NAA: SARS-CoV-2, NAA: NOT DETECTED

## 2019-04-06 ENCOUNTER — Other Ambulatory Visit: Payer: Self-pay | Admitting: *Deleted

## 2019-04-08 ENCOUNTER — Ambulatory Visit: Payer: No Typology Code available for payment source | Admitting: Vascular Surgery

## 2019-04-08 ENCOUNTER — Ambulatory Visit (HOSPITAL_COMMUNITY): Payer: No Typology Code available for payment source

## 2019-04-29 ENCOUNTER — Encounter: Payer: Self-pay | Admitting: Vascular Surgery

## 2019-04-29 ENCOUNTER — Ambulatory Visit (INDEPENDENT_AMBULATORY_CARE_PROVIDER_SITE_OTHER): Payer: No Typology Code available for payment source | Admitting: Vascular Surgery

## 2019-04-29 ENCOUNTER — Other Ambulatory Visit: Payer: Self-pay

## 2019-04-29 VITALS — BP 127/76 | HR 81 | Temp 98.0°F | Resp 16 | Ht 72.0 in | Wt 235.0 lb

## 2019-04-29 DIAGNOSIS — I83811 Varicose veins of right lower extremities with pain: Secondary | ICD-10-CM

## 2019-04-29 HISTORY — PX: ENDOVENOUS ABLATION SAPHENOUS VEIN W/ LASER: SUR449

## 2019-04-29 NOTE — Progress Notes (Signed)
Patient name: Marc Wiggins MRN: [id number] DOB: 01-24-1976 Sex: male  REASON FOR VISIT:   For laser ablation of the right great saphenous vein with 10-20 stabs  HPI:   Marc Wiggins is a pleasant 43 y.o. male who is had a long history of varicose veins of the right lower extremity.  He works on his feet and is on his feet 8 hours a day.  He has had increasing problems with swelling and pain in his right leg.  He failed conservative treatment including thigh-high compression stockings, leg elevation, and ibuprofen as needed for pain.  I saw the patient on 02/25/2019.  He had large dilated varicose veins of the right leg in the posterior calf especially which are under significant pressure.  I looked at his right great saphenous vein myself with the SonoSite which was markedly dilated with reflux from the saphenofemoral junction to the proximal calf.  I felt that we could cannulate the vein at the level of the knee.  His formal venous duplex at that time which had been done in June showed no evidence of DVT.  He had deep venous reflux involving the common femoral vein and popliteal vein.  He had superficial venous reflux in the great saphenous vein from the saphenofemoral junction to the mid calf.  The vein was significantly dilated with diameters ranging from 0.79 in the proximal calf to 0.96 in the proximal thigh.  Current Outpatient Medications  Medication Sig Dispense Refill  . fexofenadine (ALLEGRA) 180 MG tablet Take 180 mg daily by mouth.    . hyoscyamine (LEVSIN) 0.125 MG tablet Take one by mouth every 4-6 hours as needed for abdominal cramping or pain 15 tablet 0  . loperamide (IMODIUM) 2 MG capsule Take 1 by mouth every 8 hours if needed for diarrhea.-Do not take if diarrhea is getting better 6 capsule 0  . Multiple Vitamin (MULTIVITAMIN) tablet Take 1 tablet daily by mouth.    . traZODone (DESYREL) 100 MG tablet Take 1 tablet (100 mg total) by mouth at bedtime as needed for sleep.  90 tablet 1   No current facility-administered medications for this visit.     REVIEW OF SYSTEMS:  [X]  denotes positive finding, [ ]  denotes negative finding Vascular    Leg swelling    Cardiac    Chest pain or chest pressure:    Shortness of breath upon exertion:    Short of breath when lying flat:    Irregular heart rhythm:    Constitutional    Fever or chills:     PHYSICAL EXAM:   Vitals:   04/29/19 1125  BP: 127/76  Pulse: 81  Resp: 16  Temp: 98 F (36.7 C)  TempSrc: Temporal  SpO2: 98%  Weight: 235 lb (106.6 kg)  Height: 6' (1.829 m)    GENERAL: The patient is a well-nourished male, in no acute distress. The vital signs are documented above.  DATA:   No new data  MEDICAL ISSUES:   LASER ABLATION RIGHT GREAT SAPHENOUS VEIN/10-20 STABS: Patient was taken to the exam room and the veins were marked with the patient standing.  Patient was then placed supine.  I interrogated the great saphenous vein with the SonoSite and elected to cannulate at the level of the knee.  The right leg was prepped and draped in usual sterile fashion.  Under ultrasound guidance, after the skin was anesthetized, the great saphenous vein was cannulated at the level of the knee with a micropuncture needle and  a micropuncture sheath was introduced over a wire.  The J-wire was then advanced to the saphenofemoral junction.  The sheath was advanced over the wire and positioned 3 cm distal to the saphenofemoral junction.  The laser fiber was in position at the end of the sheath and the sheath retracted.  I was careful to preserve the superficial epigastric vein.  Tumescent anesthesia was administered circumferentially around the entire vein and the patient was then placed in Trendelenburg.  Laser ablation was performed of the right great saphenous vein from 3 cm distal to the saphenofemoral junction to the level of the knee.  Next tumescent anesthesia was administered around all the marked veins.  Small  stab incisions were made with an 11 blade and then using" the vein was retracted above the skin and bluntly excised using hemostats.  Pressure was held for hemostasis.  Approximately 20 stabs were made.  A sterile dressing was applied.  Patient tolerated procedure well.  He will return in 2 weeks for follow-up duplex.  Waverly Ferrari [facility] [place] [phone]

## 2019-04-29 NOTE — Progress Notes (Signed)
     Laser Ablation Procedure    Date: 04/29/2019   Marc Wiggins DOB:09-03-1975  Consent signed: Yes    Surgeon: Gae Gallop MD   Procedure: Laser Ablation: right Greater Saphenous Vein  BP 127/76 (BP Location: Left Arm, Patient Position: Sitting, Cuff Size: Normal)   Pulse 81   Temp 98 F (36.7 C) (Temporal)   Resp 16   Ht 6' (1.829 m)   Wt 235 lb (106.6 kg)   SpO2 98%   BMI 31.87 kg/m   Tumescent Anesthesia: 600 cc 0.9% NaCl with 50 cc Lidocaine HCL 1%  and 15 cc 8.4% NaHCO3  Local Anesthesia: 10 cc Lidocaine HCL and NaHCO3 (ratio 2:1)  17 watts continuous mode        Total energy: 2327 Joules   Total time: 2:17  Laser Fiber Ref. # [id number]      Lot # [id number]   Stab Phlebectomy: 10-20 Sites: Thigh and Calf  Patient tolerated procedure well  Notes: Patient wore face mask.  All staff members wore facial masks and facial shields/goggles.    Description of Procedure:  After marking the course of the secondary varicosities, the patient was placed on the operating table in the supine position, and the right leg was prepped and draped in sterile fashion.   Local anesthetic was administered and under ultrasound guidance the saphenous vein was accessed with a micro needle and guide wire; then the mirco puncture sheath was placed.  A guide wire was inserted saphenofemoral junction , followed by a 5 french sheath.  The position of the sheath and then the laser fiber below the junction was confirmed using the ultrasound.  Tumescent anesthesia was administered along the course of the saphenous vein using ultrasound guidance. The patient was placed in Trendelenburg position and protective laser glasses were placed on patient and staff, and the laser was fired at 15 watts continuous mode advancing 1-66mm/second for a total of 2327 joules.   For stab phlebectomies, local anesthetic was administered at the previously marked varicosities, and tumescent anesthesia was administered  around the vessels.  Ten to 20 stab wounds were made using the tip of an 11 blade. And using the vein hook, the phlebectomies were performed using a hemostat to avulse the varicosities.  Adequate hemostasis was achieved.     Steri strips were applied to the stab wounds and ABD pads and thigh high compression stockings were applied.  Ace wrap bandages were applied over the phlebectomy sites and at the top of the saphenofemoral junction. Blood loss was less than 15 cc.  Discharge instructions reviewed with patient and hardcopy of discharge instructions given to patient to take home. The patient ambulated out of the operating room having tolerated the procedure well.

## 2019-05-12 ENCOUNTER — Telehealth (HOSPITAL_COMMUNITY): Payer: Self-pay | Admitting: *Deleted

## 2019-05-12 NOTE — Telephone Encounter (Signed)

## 2019-05-13 ENCOUNTER — Other Ambulatory Visit: Payer: Self-pay

## 2019-05-13 ENCOUNTER — Ambulatory Visit (INDEPENDENT_AMBULATORY_CARE_PROVIDER_SITE_OTHER): Payer: Self-pay | Admitting: Vascular Surgery

## 2019-05-13 ENCOUNTER — Encounter: Payer: Self-pay | Admitting: Vascular Surgery

## 2019-05-13 ENCOUNTER — Ambulatory Visit (HOSPITAL_COMMUNITY)
Admission: RE | Admit: 2019-05-13 | Discharge: 2019-05-13 | Disposition: A | Discharge status: Home / Self Care | Payer: No Typology Code available for payment source | Source: Ambulatory Visit | Attending: Vascular Surgery | Admitting: Vascular Surgery

## 2019-05-13 VITALS — BP 130/81 | HR 71 | Temp 98.7°F | Resp 14 | Ht 72.0 in | Wt 238.0 lb

## 2019-05-13 DIAGNOSIS — I83811 Varicose veins of right lower extremities with pain: Secondary | ICD-10-CM | POA: Diagnosis present

## 2019-05-13 DIAGNOSIS — I872 Venous insufficiency (chronic) (peripheral): Secondary | ICD-10-CM

## 2019-05-13 NOTE — Progress Notes (Signed)
   Patient name: Marc Wiggins MRN: [id number] DOB: February 14, 1976 Sex: male  REASON FOR VISIT:   Follow-up after laser ablation of the right great saphenous vein with 10-20 stabs  HPI:   Marc Wiggins is a pleasant 43 y.o. male who underwent laser ablation of the right great saphenous vein from 3 cm distal to the saphenofemoral junction to the knee.  He also had stab phlebectomies.  He comes in for a follow-up visit.  He has no specific complaints.  He has been gradually resuming his normal activities.  He has no significant leg swelling.  Current Outpatient Medications  Medication Sig Dispense Refill  . fexofenadine (ALLEGRA) 180 MG tablet Take 180 mg daily by mouth.    . hyoscyamine (LEVSIN) 0.125 MG tablet Take one by mouth every 4-6 hours as needed for abdominal cramping or pain 15 tablet 0  . loperamide (IMODIUM) 2 MG capsule Take 1 by mouth every 8 hours if needed for diarrhea.-Do not take if diarrhea is getting better 6 capsule 0  . Multiple Vitamin (MULTIVITAMIN) tablet Take 1 tablet daily by mouth.    . traZODone (DESYREL) 100 MG tablet Take 1 tablet (100 mg total) by mouth at bedtime as needed for sleep. 90 tablet 1   No current facility-administered medications for this visit.     REVIEW OF SYSTEMS:  [X]  denotes positive finding, [ ]  denotes negative finding Vascular    Leg swelling    Cardiac    Chest pain or chest pressure:    Shortness of breath upon exertion:    Short of breath when lying flat:    Irregular heart rhythm:    Constitutional    Fever or chills:     PHYSICAL EXAM:   Vitals:   05/13/19 1024  BP: 130/81  Pulse: 71  Resp: 14  Temp: 98.7 F (37.1 C)  TempSrc: Temporal  SpO2: 99%  Weight: 238 lb (108 kg)  Height: 6' (1.829 m)    GENERAL: The patient is a well-nourished male, in no acute distress. The vital signs are documented above. CARDIOVASCULAR: There is a regular rate and rhythm. PULMONARY: There is good air exchange bilaterally  without wheezing or rales. He has minimal bruising in the medial right thigh.  He has no significant lower extremity swelling. The Steri-Strips are in place.  DATA:   VENOUS DUPLEX: I have independently interpreted his duplex today.  There is no evidence of DVT in the right lower extremity.  The right great saphenous vein is closed from just distal to the saphenofemoral junctions of the knee.  MEDICAL ISSUES:   STATUS POST LASER ABLATION RIGHT GREAT SAPHENOUS VEIN AND STAB PHLEBECTOMIES: Patient is doing well status post laser ablation of the right great saphenous vein with stab phlebectomies.  I encouraged him to continue to exercise and wear knee-high compression stockings when he is on his feet for prolonged period of time.  He should elevate his legs daily and try to avoid prolonged sitting and standing.  I will see him back as needed.  Deitra Mayo [facility] [phone]

## 2019-05-17 ENCOUNTER — Encounter: Payer: Self-pay | Admitting: Vascular Surgery

## 2019-06-10 ENCOUNTER — Encounter: Payer: Self-pay | Admitting: Vascular Surgery

## 2019-07-19 MED FILL — traZODone HCL 100 MG TABS: 100 | 90 days supply | Qty: 90 | Fill #1

## 2019-11-17 ENCOUNTER — Other Ambulatory Visit: Payer: Self-pay

## 2019-11-17 ENCOUNTER — Ambulatory Visit (INDEPENDENT_AMBULATORY_CARE_PROVIDER_SITE_OTHER): Payer: No Typology Code available for payment source | Admitting: Osteopathic Medicine

## 2019-11-17 ENCOUNTER — Encounter: Payer: Self-pay | Admitting: Osteopathic Medicine

## 2019-11-17 VITALS — BP 110/67 | HR 51 | Temp 98.0°F | Wt 233.0 lb

## 2019-11-17 DIAGNOSIS — F5101 Primary insomnia: Secondary | ICD-10-CM | POA: Diagnosis not present

## 2019-11-17 DIAGNOSIS — Z0000 Encounter for general adult medical examination without abnormal findings: Secondary | ICD-10-CM | POA: Diagnosis not present

## 2019-11-17 MED ORDER — TRAZODONE HCL 100 MG PO TABS: 100.0000 mg | ORAL_TABLET | Freq: Every evening | ORAL | 2 refills | Status: DC | PRN

## 2019-11-17 MED ORDER — TRAZODONE HCL 100 MG PO TABS: 100.0000 mg | ORAL_TABLET | Freq: Every evening | ORAL | 1 refills | Status: DC | PRN

## 2019-11-17 NOTE — Progress Notes (Signed)
HPI: Marc Wiggins is a 44 y.o. male who  has no past medical history on file.  he presents to [facility] today, 11/17/19,  for chief complaint of: Annual physical     Patient here for annual physical / wellness exam.  See preventive care reviewed as below.  Recent labs reviewed in detail with the patient.   Additional concerns today include:  Needs refills - has been taking trazodone daily and has been out Anxiety flare past few days, no trigger known     Past medical, surgical, social and family history reviewed:  Patient Active Problem List   Diagnosis Date Noted  . Current moderate episode of major depressive disorder without prior episode (HCC) 06/29/2018  . 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 Generalized anxiety disorder 04/23/2017    Past Surgical History:  Procedure Laterality Date  . CHOLECYSTECTOMY    . ENDOVENOUS ABLATION SAPHENOUS VEIN W/ LASER Right 04/29/2019   endovenous laser ablation right greater saphenous vein and stab phlebectomy 10-20 incisions right leg by Cari Caraway MD     Social History   Tobacco Use  . Smoking status: Never Smoker  . Smokeless tobacco: Never Used  Substance Use Topics  . Alcohol use: Never    Family History  Problem Relation Age of Onset  . Hypertension Father   . Diabetes Maternal Aunt   . Stroke Maternal Grandmother   . Diabetes Maternal Grandmother   . Heart attack Maternal Grandmother   . Cancer Paternal Grandmother      Current medication list and allergy/intolerance information reviewed:    Current Outpatient Medications  Medication Sig Dispense Refill  . fexofenadine (ALLEGRA) 180 MG tablet Take 180 mg daily by mouth.    . Multiple Vitamin (MULTIVITAMIN) tablet Take 1 tablet daily by mouth.    . traZODone (DESYREL) 100 MG tablet Take 1 tablet (100 mg total) by mouth at bedtime as needed for sleep. 90 tablet 1   No current facility-administered  medications for this visit.    Allergies  Allergen Reactions  . Hydrocodone        Exam:  BP 110/67 (BP Location: Left Arm, Patient Position: Sitting, Cuff Size: Large)   Pulse (!) 51   Temp 98 F (36.7 C)   Wt 233 lb (105.7 kg)   SpO2 100%   BMI 31.60 kg/m   Constitutional: VS see above. General Appearance: alert, well-developed, well-nourished, NAD  Eyes: Normal lids and conjunctive, non-icteric sclera  Ears, Nose, Mouth, Throat:. TM normal bilaterally.   Neck: No masses, trachea midline. No thyroid enlargement. No tenderness/mass appreciated. No lymphadenopathy  Respiratory: Normal respiratory effort. no wheeze, no rhonchi, no rales  Cardiovascular: S1/S2 normal, no murmur, no rub/gallop auscultated. RRR. No lower extremity edema.   Gastrointestinal: Nontender, no masses. No hepatomegaly, no splenomegaly. No hernia appreciated. Bowel sounds normal. Rectal exam deferred.   Musculoskeletal: Gait normal. No clubbing/cyanosis of digits.   Neurological: Normal balance/coordination. No tremor. No cranial nerve deficit on limited exam. Motor and sensation intact and symmetric. Cerebellar reflexes intact.   Skin: warm, dry, intact.  In center of back there is a patch of very slight hyperpigmentation with an area at about 1:00 that shows some eczematous features with scaling and excoriation.  There is a normal appearing nevus at about the 7 o'clock position  Psychiatric: Normal judgment/insight. Normal mood and affect. Oriented x3.    No results found for this or any previous visit (from the past 72  hour(s)).    ASSESSMENT/PLAN: The primary encounter diagnosis was Annual physical exam. 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  Anxiety - restart Trazodone, RTC if no better, call if seeking referral for therapist   Orders Placed This Encounter  Procedures  . CBC  . COMPLETE METABOLIC PANEL WITH GFR  . Lipid panel    Meds ordered this  encounter  Medications  . traZODone (DESYREL) 100 MG tablet    Sig: Take 1 tablet (100 mg total) by mouth at bedtime as needed for sleep.    Dispense:  90 tablet    Refill:  1    Ok to mail to patient    Patient Instructions  General Preventive Care  Most recent routine screening lipids/other labs: ordered today   Everyone should have blood pressure checked once per year.   Tobacco: don't!   Alcohol: responsible moderation is ok for most adults - if you have concerns about your alcohol intake, please talk to me!   Exercise: as tolerated to reduce risk of cardiovascular disease and diabetes. Strength training will also prevent osteoporosis.   Mental health: if need for mental health care (medicines, counseling, other), or concerns about moods, please let me know!   Sexual health: if need for STD testing, or if concerns with libido/pain problems, please let me know!   Advanced Directive: Living Will and/or Healthcare Power of Attorney recommended for all adults, regardless of age or health.  Vaccines  Flu vaccine: recommended for almost everyone, every fall.   Shingles vaccine: Shingrix recommended age 36.   Pneumonia vaccines: recommended age 61.  Tetanus booster: Tdap recommended every 10 years.  Cancer screenings   Colon cancer screening: recommended for everyone at age 69-75  Prostate cancer screening: PSA blood test around age 62  Lung cancer screening: not needed for non-smokers  Infection screenings  HIV, Gonorrhea/Chlamydia: screening as needed  Hepatitis C: recommended for anyone born [id number]  TB: certain at-risk populations, or depending on work requirements and/or travel history Other  Bone Density Test: recommended for men at age 29  Abdominal Aortic Aneurysm: screening with ultrasound recommended once for men age 79-75 who have ever smoked        Visit summary with medication list and pertinent instructions was printed for patient to review.  All questions at time of visit were answered - patient instructed to contact office with any additional concerns or updates. ER/RTC precautions were reviewed with the patient.     Please note: voice recognition software was used to produce this document, and typos may escape review. Please contact Dr. Sheppard Coil for any needed clarifications.     Follow-up plan: Return in about 1 year (around 11/16/2020) for [place] (call week prior to visit for lab orders).

## 2019-11-17 NOTE — Patient Instructions (Addendum)
General Preventive Care  Most recent routine screening lipids/other labs: ordered today   Everyone should have blood pressure checked once per year.   Tobacco: don't!   Alcohol: responsible moderation is ok for most adults - if you have concerns about your alcohol intake, please talk to me!   Exercise: as tolerated to reduce risk of cardiovascular disease and diabetes. Strength training will also prevent osteoporosis.   Mental health: if need for mental health care (medicines, counseling, other), or concerns about moods, please let me know!   Sexual health: if need for STD testing, or if concerns with libido/pain problems, please let me know!   Advanced Directive: Living Will and/or Healthcare Power of Attorney recommended for all adults, regardless of age or health.  Vaccines  Flu vaccine: recommended for almost everyone, every fall.   Shingles vaccine: Shingrix recommended age 12.   Pneumonia vaccines: recommended age 48.  Tetanus booster: Tdap recommended every 10 years.  Cancer screenings   Colon cancer screening: recommended for everyone at age 74-75  Prostate cancer screening: PSA blood test around age 29  Lung cancer screening: not needed for non-smokers  Infection screenings  HIV, Gonorrhea/Chlamydia: screening as needed  Hepatitis C: recommended for anyone born 08-1961  TB: certain at-risk populations, or depending on work requirements and/or travel history Other  Bone Density Test: recommended for men at age 2  Abdominal Aortic Aneurysm: screening with ultrasound recommended once for men age 4-75 who have ever smoked

## 2019-11-18 LAB — COMPLETE METABOLIC PANEL WITH GFR
AG Ratio: 2.2 (calc) (ref 1.0–2.5)
ALT: 18 U/L (ref 9–46)
AST: 19 U/L (ref 10–40)
Albumin: 4.3 g/dL (ref 3.6–5.1)
Alkaline phosphatase (APISO): 68 U/L (ref 36–130)
BUN: 23 mg/dL (ref 7–25)
CO2: 30 mmol/L (ref 20–32)
Calcium: 9.3 mg/dL (ref 8.6–10.3)
Chloride: 102 mmol/L (ref 98–110)
Creat: 1.02 mg/dL (ref 0.60–1.35)
GFR, Est African American: 105 mL/min/{1.73_m2} (ref >=60)
GFR, Est Non African American: 90 mL/min/{1.73_m2} (ref >=60)
Globulin: 2 g/dL (calc) (ref 1.9–3.7)
Glucose, Bld: 104 mg/dL — ABNORMAL HIGH (ref 65–99)
Potassium: 4.9 mmol/L (ref 3.5–5.3)
Sodium: 137 mmol/L (ref 135–146)
Total Bilirubin: 0.7 mg/dL (ref 0.2–1.2)
Total Protein: 6.3 g/dL (ref 6.1–8.1)

## 2019-11-18 LAB — CBC
HCT: 43.6 % (ref 38.5–50.0)
Hemoglobin: 14.9 g/dL (ref 13.2–17.1)
MCH: 33 pg (ref 27.0–33.0)
MCHC: 34.2 g/dL (ref 32.0–36.0)
MCV: 96.5 fL (ref 80.0–100.0)
MPV: 11 fL (ref 7.5–12.5)
Platelets: 196 10*3/uL (ref 140–400)
RBC: 4.52 10*6/uL (ref 4.20–5.80)
RDW: 11.8 % (ref 11.0–15.0)
WBC: 6.9 10*3/uL (ref 3.8–10.8)

## 2019-11-18 LAB — LIPID PANEL
Cholesterol: 165 mg/dL (ref <200)
HDL: 71 mg/dL (ref >=40)
LDL Cholesterol (Calc): 82 mg/dL (calc)
Non-HDL Cholesterol (Calc): 94 mg/dL (calc) (ref <130)
Total CHOL/HDL Ratio: 2.3 (calc) (ref <5.0)
Triglycerides: 43 mg/dL (ref <150)

## 2020-01-27 ENCOUNTER — Ambulatory Visit (HOSPITAL_COMMUNITY)
Admission: AD | Admit: 2020-01-27 | Discharge: 2020-01-27 | Disposition: A | Discharge status: Home / Self Care | Payer: No Typology Code available for payment source | Attending: Psychiatry | Admitting: Psychiatry

## 2020-01-27 NOTE — H&P (Signed)
Behavioral Health Medical Screening Exam  Marc Wiggins is a  44 y.o. Caucasian male with prior hx of severe insomnia. He walked-in to the [facility] with his mother [name]) seeking help for his worsening anxiety (panic symptoms/insomnia). This patient denies any hx of depression or mental health treatments. During this medical screening evaluation, Marc Wiggins reports, "I have been having real bad anxiety. It has been ongoing on for a while but has worsened in the last 3 days. It has gotten to a point whereby I was having panic attacks. I had some issues with anxiety sometime last year or so. Last January, I saw my primary care provider. I did express how bad my anxiety has been, which later turned into bad insomnia. I have not been sleeping for a while. My doctor then gave me some Ambien for few nights. She also prescribed me some anxiety medicine she referred as strong Benadryl. She encouraged me to see a psychiatrist for the anxiety/insomnia. I did make an appointment with a psychiatrist but the COVID 19 got very bad & that appointment was cancelled. I have not been able to see or talk to any other psychiatric provider since that time. But the anxiety/insomnia has gotten worse. I don't think that I'm depressed. I don't drink alcohol or use any kind of drugs. I have no bad medical issues or history. I'm happy with my life, nothing to complain about really". Marc Wiggins at this time denies any SIHI, AVH, delusional thoughts or paranoia. He does not appear to be responding to any internal stimuli. He denies any mental health hospitalizations. At this time, based on the information & symptoms presented by the patient, he currently does not meet criteria for inpatient psychiatric admission but will be referred to the [facility] for further evaluation/treatment.  Total Time spent with patient: 25 minutes  Psychiatric Specialty Exam: Physical Exam Constitutional:      Appearance: Normal appearance.  HENT:     Head:  Normocephalic.     Nose: Nose normal.     Mouth/Throat:     Pharynx: Oropharynx is clear.  Eyes:     Pupils: Pupils are equal, round, and reactive to light.  Cardiovascular:     Rate and Rhythm: Normal rate.     Pulses: Normal pulses.  Pulmonary:     Effort: Pulmonary effort is normal.  Genitourinary:    Comments: Deferred Musculoskeletal:        General: Normal range of motion.     Cervical back: Normal range of motion.  Skin:    General: Skin is warm and dry.  Neurological:     Mental Status: He is alert and oriented to person, place, and time.    Review of Systems  Constitutional: Negative for chills, diaphoresis and fever.  HENT: Negative for congestion, rhinorrhea, sneezing and sore throat.   Eyes: Negative for discharge.  Respiratory: Negative for cough, chest tightness, shortness of breath and wheezing.   Cardiovascular: Negative for chest pain and palpitations.  Gastrointestinal: Negative for diarrhea, nausea and vomiting.  Endocrine: Negative for cold intolerance.  Genitourinary: Negative for difficulty urinating.  Musculoskeletal: Negative for arthralgias and myalgias.  Allergic/Immunologic: Positive for environmental allergies (Seasonal allergies).       Allergies: Hydrocodone  Neurological: Negative for dizziness, tremors, seizures, syncope, weakness, light-headedness, numbness and headaches.  Psychiatric/Behavioral: Positive for sleep disturbance. Negative for agitation, behavioral problems, confusion, decreased concentration, dysphoric mood, hallucinations, self-injury and suicidal ideas. The patient is nervous/anxious. The patient is not hyperactive.    There  were no vitals taken for this visit.There is no height or weight on file to calculate BMI. General Appearance: Casual and Well Groomed Eye Contact:  Good Speech:  Clear and Coherent and Normal Rate Volume:  Normal Mood:  Anxious Affect:  Appropriate Thought Process:  Coherent, Goal Directed and  Descriptions of Associations: Intact Orientation:  Full (Time, Place, and Person) Thought Content:  Logical, denies any hallucinations, delusions or paranoia. Suicidal Thoughts:  Denies any thoughts, plans or intent. Denies any hx of attempts Homicidal Thoughts:  Denies Memory:  Immediate;   Good Recent;   Good Remote;   Good Judgement:  Intact Insight:  Present Psychomotor Activity:  Normal Concentration: Concentration: Good and Attention Span: Good Recall:  Good Fund of Knowledge:Good Language: Good Akathisia:  NA Handed:  Right AIMS (if indicated):    Assets:  Communication Skills Desire for Improvement Financial Resources/Insurance Physical Health Resilience Social Support Sleep:     Musculoskeletal: Strength & Muscle Tone: within normal limits Gait & Station: normal Patient leans: N/A  Recommendations: [facility] for further evaluation & treatment.  Based on my evaluation the patient does not appear to have an emergency medical condition.  Armandina Stammer, NP, PMHNP, FNP-BC 01/27/2020, 12:59 PM

## 2020-01-27 NOTE — BH Assessment (Signed)
Assessment Note  Marc Wiggins is an 44 y.o. male who presented to [facility] with his mother, Baldo Hufnagle, seeking help for his anxiety.  Patient states that he has been experiencing extreme anxiety almost to the point of panic attacks.  He tired to see his PCP today, but could not get an appointment. Patient states that his anxiety is the worst it has ever been, but states that he does not want to be on a lot of medication for it.  However, he states that he cannot continue to go on this way.  He is unable to identify anything out of the ordinary that is causing his anxiety to be this bad.  Patient states that he is always stressed at work, but that is not new for him.  Patient is married and they have a one year old child, but he states that everything is good at home.  Patient states that he has no previous mental health treatment on an inpatient or outpatient basis.  He denies experiencing any type of hallucination and states that he does not use alcohol or drugs. He denies any history of abuse or self-mutilation.  Patient presents as alert and oriented. He is highly anxious.  His judgment, insight and impulse control appear to be intact. He does not appear to be responding to any internal stimuli.   Diagnosis: F41.1 Generalized Anxiety Disorder  Past Medical History: No past medical history on file.  Past Surgical History:  Procedure Laterality Date  . CHOLECYSTECTOMY    . ENDOVENOUS ABLATION SAPHENOUS VEIN W/ LASER Right 04/29/2019   endovenous laser ablation right greater saphenous vein and stab phlebectomy 10-20 incisions right leg by Cari Caraway MD     Family History:  Family History  Problem Relation Age of Onset  . Hypertension Father   . Diabetes Maternal Aunt   . Stroke Maternal Grandmother   . Diabetes Maternal Grandmother   . Heart attack Maternal Grandmother   . Cancer Paternal Grandmother     Social History:  reports that he has never smoked. He has never used  smokeless tobacco. He reports that he does not drink alcohol and does not use drugs.  Additional Social History:  Alcohol / Drug Use Pain Medications: see MAR Prescriptions: see MAR Over the Counter: see MAR History of alcohol / drug use?: No history of alcohol / drug abuse Longest period of sobriety (when/how long): N/A  CIWA:   COWS:    Allergies:  Allergies  Allergen Reactions  . Hydrocodone     Home Medications: (Not in a hospital admission)   OB/GYN Status:  No LMP for male patient.  General Assessment Data Location of Assessment: [facility] Assessment Services TTS Assessment: In system Is this a Tele or Face-to-Face Assessment?: Face-to-Face Is this an Initial Assessment or a Re-assessment for this encounter?: Initial Assessment Patient Accompanied by:: Parent Language Other than English: No Living Arrangements: Other (Comment) What gender do you identify as?: Male Marital status: Married Living Arrangements: Spouse/significant other Can pt return to current living arrangement?: Yes Admission Status: Voluntary Is patient capable of signing voluntary admission?: Yes Referral Source: Self/Family/Friend Insurance type:  ([facility])  Medical Screening Exam [facility]) Medical Exam completed: Yes  Crisis Care Plan Living Arrangements: Spouse/significant other Legal Guardian: Other: (self) Name of Psychiatrist: none Name of Therapist: none  Education Status Is patient currently in school?: No Is the patient employed, unemployed or receiving disability?: Employed  Risk to self with the past 6 months Suicidal  Ideation: No Has patient been a risk to self within the past 6 months prior to admission? : No Suicidal Intent: No Has patient had any suicidal intent within the past 6 months prior to admission? : No Is patient at risk for suicide?: No Suicidal Plan?: No Has patient had any suicidal plan within the past 6 months prior to admission? :  No Access to Means: No What has been your use of drugs/alcohol within the last 12 months?: none Previous Attempts/Gestures: No How many times?: 0 Other Self Harm Risks: none Triggers for Past Attempts: None known Intentional Self Injurious Behavior: None Family Suicide History: No Recent stressful life event(s): Other (Comment) (stress at work) Persecutory voices/beliefs?: No Depression: No Substance abuse history and/or treatment for substance abuse?: No Suicide prevention information given to non-admitted patients: Not applicable  Risk to Others within the past 6 months Homicidal Ideation: No Does patient have any lifetime risk of violence toward others beyond the six months prior to admission? : No Thoughts of Harm to Others: No Current Homicidal Intent: No Current Homicidal Plan: No Access to Homicidal Means: No Identified Victim: none History of harm to others?: No Assessment of Violence: None Noted Violent Behavior Description: none Does patient have access to weapons?: No Criminal Charges Pending?: No Does patient have a court date: No Is patient on probation?: No  Psychosis Hallucinations: None noted Delusions: None noted  Mental Status Report Appearance/Hygiene: Unremarkable Eye Contact: Good Motor Activity: Unremarkable Speech: Logical/coherent Level of Consciousness: Alert Mood: Anxious Affect: Anxious Anxiety Level: Severe Thought Processes: Coherent, Relevant Judgement: Unimpaired Orientation: Person, Place, Time, Situation Obsessive Compulsive Thoughts/Behaviors: None  Cognitive Functioning Concentration: Normal Memory: Recent Intact, Remote Intact Is patient IDD: No Insight: Good Impulse Control: Good Appetite: Poor Have you had any weight changes? : No Change Sleep: Decreased Vegetative Symptoms: None  ADLScreening [facility] Assessment Services) Patient's cognitive ability adequate to safely complete daily activities?: Yes Patient able to  express need for assistance with ADLs?: Yes Independently performs ADLs?: Yes (appropriate for developmental age)  Prior Inpatient Therapy Prior Inpatient Therapy: No  Prior Outpatient Therapy Prior Outpatient Therapy: No Does patient have an ACCT team?: No Does patient have Intensive In-House Services?  : No Does patient have [facility] services? : No Does patient have P4CC services?: No  ADL Screening (condition at time of admission) Patient's cognitive ability adequate to safely complete daily activities?: Yes Is the patient deaf or have difficulty hearing?: No Does the patient have difficulty seeing, even when wearing glasses/contacts?: No Does the patient have difficulty concentrating, remembering, or making decisions?: No Patient able to express need for assistance with ADLs?: Yes Does the patient have difficulty dressing or bathing?: No Independently performs ADLs?: Yes (appropriate for developmental age) Does the patient have difficulty walking or climbing stairs?: No Weakness of Legs: None Weakness of Arms/Hands: None  Home Assistive Devices/Equipment Home Assistive Devices/Equipment: None  Therapy Consults (therapy consults require a physician order) PT Evaluation Needed: No OT Evalulation Needed: No SLP Evaluation Needed: No Abuse/Neglect Assessment (Assessment to be complete while patient is alone) Abuse/Neglect Assessment Can Be Completed: Yes Physical Abuse: Denies Verbal Abuse: Denies Sexual Abuse: Denies Exploitation of patient/patient's resources: Denies Self-Neglect: Denies Values / Beliefs Cultural Requests During Hospitalization: None Spiritual Requests During Hospitalization: None Consults Spiritual Care Consult Needed: No Transition of Care Team Consult Needed: No Advance Directives (For Healthcare) Does Patient Have a Medical Advance Directive?: No Would patient like information on creating a medical advance directive?: No - Patient  declined Nutrition Screen- [facility] Adult/WL/AP Has the patient recently lost weight without trying?: No Has the patient been eating poorly because of a decreased appetite?: No Malnutrition Screening Tool Score: 0        Disposition: Per Agens Nwoko, NP, patient does not meet inpatient admission criteria and can follow-up with an OP Provider. Disposition Initial Assessment Completed for this Encounter: Yes Disposition of Patient:  (Patient to be [facility] to the [facility])  On Site Evaluation by:   Reviewed with Physician:    Arnoldo Lenis Ernst [place] 01/27/2020 12:46 PM

## 2020-02-10 ENCOUNTER — Encounter: Payer: Self-pay | Admitting: Osteopathic Medicine

## 2020-02-10 ENCOUNTER — Telehealth (INDEPENDENT_AMBULATORY_CARE_PROVIDER_SITE_OTHER): Payer: No Typology Code available for payment source | Admitting: Osteopathic Medicine

## 2020-02-10 VITALS — Wt 221.0 lb

## 2020-02-10 DIAGNOSIS — F5101 Primary insomnia: Secondary | ICD-10-CM | POA: Diagnosis not present

## 2020-02-10 DIAGNOSIS — F411 Generalized anxiety disorder: Secondary | ICD-10-CM

## 2020-02-10 DIAGNOSIS — F410 Panic disorder [episodic paroxysmal anxiety] without agoraphobia: Secondary | ICD-10-CM

## 2020-02-10 MED ORDER — DULOXETINE HCL 20 MG PO CPEP: ORAL_CAPSULE | ORAL | 0 refills | Status: DC

## 2020-02-10 MED ORDER — TRAZODONE HCL 150 MG PO TABS: 150.0000 mg | ORAL_TABLET | Freq: Every evening | ORAL | 0 refills | Status: DC | PRN

## 2020-02-10 MED FILL — DULoxetine HCL 20 MG CPEP: 20 | 45 days supply | Qty: 90 | Fill #0

## 2020-02-10 MED FILL — traZODone HCL 150 MG TABS: 150 | 90 days supply | Qty: 90 | Fill #0

## 2020-02-10 NOTE — Patient Instructions (Addendum)
Plan: Will try restarting Cymbalta at low dose w/ titration up in 2 weeks, or option to stay at low dose. Will also trial increasing Trazodone to 150 mg to help with sleep. I sent referral for counseling, you soul get a call from [facility] about this. Let's regroup in a couple weeks to see how you're doing on the medications, but please contact me sooner if any concerns come up!

## 2020-02-10 NOTE — Progress Notes (Signed)
Virtual Visit via Video (App used: MyChart) Note  I connected with      Marc Wiggins on 02/10/20 at 8:08 AM  by a telemedicine application and verified that I am speaking with the correct person using two identifiers.  Patient is in separate location  I am in office   I discussed the limitations of evaluation and management by telemedicine and the availability of in person appointments. The patient expressed understanding and agreed to proceed.  History of Present Illness: Marc Wiggins is a 44 y.o. male who would like to discuss anxiety   Recent visit to behavioral health w/ 3 days worsening anxiety, panic attacks - notes from 01/27/2020 reviewed, he did not meet criteria for inpatient admission and was referred to [facility]. He reports anxiety worsening, dad had MI few months ago, work has been stressful and he's changed jobs which has helped, famaily life is ok, nothing in particular triggering worsening symptoms but generally more anxiety lately. Feels he'd benefit from Rx/counseling.   Has been on Lexapro which caused sexual side effects, Trintellix caused headaches, Wellbutrin also not tolerated, Cymbalta seemed to work ok but he stopped it awhile back.       Observations/Objective: Wt 221 lb (100.2 kg)   BMI 29.97 kg/m  BP Readings from Last 3 Encounters:  11/17/19 110/67  05/13/19 130/81  04/29/19 127/76   Exam: Normal Speech.  NAD  Lab and Radiology Results No results found for this or any previous visit (from the past 72 hour(s)). No results found.       Assessment and Plan: 44 y.o. male with The primary encounter diagnosis was Generalized anxiety disorder with panic attacks. 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   PDMP not reviewed this encounter. Orders Placed This Encounter  Procedures  . Ambulatory referral to Behavioral Health    Referral Priority:   Routine    Referral Type:   Psychiatric    Referral Reason:    Specialty Services Required    Requested Specialty:   Behavioral Health    Number of Visits Requested:   1   Meds ordered this encounter  Medications  . DULoxetine (CYMBALTA) 20 MG capsule    Sig: Take 1 capsule (20 mg total) by mouth daily for 14 days, THEN 2 capsules (40 mg total) daily.    Dispense:  90 capsule    Refill:  0  . traZODone (DESYREL) 150 MG tablet    Sig: Take 1 tablet (150 mg total) by mouth at bedtime as needed for sleep.    Dispense:  90 tablet    Refill:  0   Patient Instructions  Plan: Will try restarting Cymbalta at low dose w/ titration up in 2 weeks, or option to stay at low dose. Will also trial increasing Trazodone to 150 mg to help with sleep. I sent referral for counseling, you soul get a call from [facility] about this. Let's regroup in a couple weeks to see how you're doing on the medications, but please contact me sooner if any concerns come up!      Instructions sent via MyChart. If MyChart not available, pt was given option for info via personal e-mail w/ no guarantee of protected health info over unsecured e-mail communication, and MyChart sign-up instructions were sent to patient.   Follow Up Instructions: Return in about 2 weeks (around 02/24/2020) for VIRTUAL VISIT RECHECK MENTAL HEALTH .    I discussed the assessment and  treatment plan with the patient. The patient was provided an opportunity to ask questions and all were answered. The patient agreed with the plan and demonstrated an understanding of the instructions.   The patient was advised to call back or seek an in-person evaluation if any new concerns, if symptoms worsen or if the condition fails to improve as anticipated.  30 minutes of non-face-to-face time was provided during this encounter.      . . . . . . . . . . . . . Marland Kitchen                   Historical information moved to improve visibility of documentation.  No past medical history on  file. Past Surgical History:  Procedure Laterality Date  . CHOLECYSTECTOMY    . ENDOVENOUS ABLATION SAPHENOUS VEIN W/ LASER Right 04/29/2019   endovenous laser ablation right greater saphenous vein and stab phlebectomy 10-20 incisions right leg by Cari Caraway MD    Social History   Tobacco Use  . Smoking status: Never Smoker  . Smokeless tobacco: Never Used  Substance Use Topics  . Alcohol use: Never   family history includes Cancer in his paternal grandmother; Diabetes in his maternal aunt and maternal grandmother; Heart attack in his maternal grandmother; Hypertension in his father; Stroke in his maternal grandmother.  Medications: Current Outpatient Medications  Medication Sig Dispense Refill  . fexofenadine (ALLEGRA) 180 MG tablet Take 180 mg daily by mouth.    . Multiple Vitamin (MULTIVITAMIN) tablet Take 1 tablet daily by mouth.    . traZODone (DESYREL) 100 MG tablet Take 1 tablet (100 mg total) by mouth at bedtime as needed for sleep. 90 tablet 2  . DULoxetine (CYMBALTA) 20 MG capsule Take 1 capsule (20 mg total) by mouth daily for 14 days, THEN 2 capsules (40 mg total) daily. 90 capsule 0  . traZODone (DESYREL) 150 MG tablet Take 1 tablet (150 mg total) by mouth at bedtime as needed for sleep. 90 tablet 0   No current facility-administered medications for this visit.   Allergies  Allergen Reactions  . Hydrocodone

## 2020-02-23 ENCOUNTER — Ambulatory Visit (INDEPENDENT_AMBULATORY_CARE_PROVIDER_SITE_OTHER): Payer: No Typology Code available for payment source | Admitting: Psychology

## 2020-02-23 DIAGNOSIS — F411 Generalized anxiety disorder: Secondary | ICD-10-CM | POA: Diagnosis not present

## 2020-02-24 ENCOUNTER — Encounter: Payer: Self-pay | Admitting: Osteopathic Medicine

## 2020-02-24 ENCOUNTER — Telehealth (INDEPENDENT_AMBULATORY_CARE_PROVIDER_SITE_OTHER): Payer: No Typology Code available for payment source | Admitting: Osteopathic Medicine

## 2020-02-24 VITALS — Wt 221.0 lb

## 2020-02-24 DIAGNOSIS — F411 Generalized anxiety disorder: Secondary | ICD-10-CM | POA: Diagnosis not present

## 2020-02-24 DIAGNOSIS — F410 Panic disorder [episodic paroxysmal anxiety] without agoraphobia: Secondary | ICD-10-CM

## 2020-02-24 DIAGNOSIS — F5101 Primary insomnia: Secondary | ICD-10-CM

## 2020-02-24 NOTE — Progress Notes (Signed)
Virtual Visit via Video (App used: Doximity) Note  I connected with      Marc Wiggins on 02/24/20 at 10:37 AM  by a telemedicine application and verified that I am speaking with the correct person using two identifiers.  Patient is in separate location from physician  I am in office   I discussed the limitations of evaluation and management by telemedicine and the availability of in person appointments. The patient expressed understanding and agreed to proceed.  History of Present Illness: Marc Wiggins is a 44 y.o. male who would like to discuss mental health follow-up.    Today 02/24/20:  Had first session with therapist yesterday  Still having some anxiety symptoms but the Cymbalta 20 mg has been helping  --> increase Cymbalta to 40 mg    Last encounter for this issue 02/10/2020:  "Visit to behavioral health w/ 3 days worsening anxiety, panic attacks - notes from 01/27/2020 reviewed, he did not meet criteria for inpatient admission and was referred to [facility]. He reports anxiety worsening, dad had MI few months ago, work has been stressful and he's changed jobs which has helped, famaily life is ok, nothing in particular triggering worsening symptoms but generally more anxiety lately. Feels he'd benefit from Rx/counseling.  "Has been on Lexapro which caused sexual side effects, Trintellix caused headaches, Wellbutrin also not tolerated, Cymbalta seemed to work ok but he stopped it awhile back." --> restarted Cymbalta at 20 mg w/ plan to titrate up to 40 mg  --> added Trazodone 150 mg qhs for sleep  --> referral for counseling     Depression screen [facility] 2/9 02/24/2020 02/10/2020 11/17/2019  Decreased Interest 0 1 1  Down, Depressed, Hopeless 0 0 1  PHQ - 2 Score 0 1 2  Altered sleeping 1 3 2   Tired, decreased energy 0 0 1  Change in appetite 0 1 1  Feeling bad or failure about yourself  0 0 1  Trouble concentrating 0 1 0  Moving slowly or fidgety/restless 0 0 0  Suicidal  thoughts 0 0 0  PHQ-9 Score 1 6 7   Difficult doing work/chores Somewhat difficult Somewhat difficult Somewhat difficult   GAD 7 : Generalized Anxiety Score 02/24/2020 02/10/2020 11/17/2019 11/17/2018  Nervous, Anxious, on Edge 2 3 2 1   Control/stop worrying 2 3 2  0  Worry too much - different things 2 3 2 1   Trouble relaxing 2 3 2 1   Restless 1 2 1  0  Easily annoyed or irritable 1 1 0 1  Afraid - awful might happen 1 1 1  0  Total GAD 7 Score 11 16 10 4   Anxiety Difficulty Somewhat difficult Somewhat difficult Somewhat difficult Not difficult at all      Observations/Objective: Wt 221 lb (100.2 kg)   BMI 29.97 kg/m  BP Readings from Last 3 Encounters:  11/17/19 110/67  05/13/19 130/81  04/29/19 127/76   Exam: Normal Speech.  NAD  Lab and Radiology Results No results found for this or any previous visit (from the past 72 hour(s)). No results found.     Assessment and Plan: 44 y.o. male with The primary encounter diagnosis was Generalized anxiety disorder with panic attacks. 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  Stable, improved, continue Trazodone and therapy, increase CYmbalta and see me in 4 weeks to recheck   PDMP not reviewed this encounter. No orders of the defined types were placed in this encounter.  No orders of the  defined types were placed in this encounter.  There are no Patient Instructions on file for this visit.    Follow Up Instructions: Return in about 4 weeks (around 03/23/2020) for VIRTUAL VISIT RECHECK MENTAL HEALTH - SEE ME SOONER IF NEEDED.    I discussed the assessment and treatment plan with the patient. The patient was provided an opportunity to ask questions and all were answered. The patient agreed with the plan and demonstrated an understanding of the instructions.   The patient was advised to call back or seek an in-person evaluation if any new concerns, if symptoms worsen or if the condition fails to improve as  anticipated.  20 minutes of non-face-to-face time was provided during this encounter.      . . . . . . . . . . . . . Marland Kitchen                   Historical information moved to improve visibility of documentation.  No past medical history on file. Past Surgical History:  Procedure Laterality Date  . CHOLECYSTECTOMY    . ENDOVENOUS ABLATION SAPHENOUS VEIN W/ LASER Right 04/29/2019   endovenous laser ablation right greater saphenous vein and stab phlebectomy 10-20 incisions right leg by Cari Caraway MD    Social History   Tobacco Use  . Smoking status: Never Smoker  . Smokeless tobacco: Never Used  Substance Use Topics  . Alcohol use: Never   family history includes Cancer in his paternal grandmother; Diabetes in his maternal aunt and maternal grandmother; Heart attack in his maternal grandmother; Hypertension in his father; Stroke in his maternal grandmother.  Medications: Current Outpatient Medications  Medication Sig Dispense Refill  . DULoxetine (CYMBALTA) 20 MG capsule Take 1 capsule (20 mg total) by mouth daily for 14 days, THEN 2 capsules (40 mg total) daily. 90 capsule 0  . fexofenadine (ALLEGRA) 180 MG tablet Take 180 mg daily by mouth.    . Multiple Vitamin (MULTIVITAMIN) tablet Take 1 tablet daily by mouth.    . traZODone (DESYREL) 150 MG tablet Take 1 tablet (150 mg total) by mouth at bedtime as needed for sleep. 90 tablet 0  . traZODone (DESYREL) 100 MG tablet Take 1 tablet (100 mg total) by mouth at bedtime as needed for sleep. 90 tablet 2   No current facility-administered medications for this visit.   Allergies  Allergen Reactions  . Hydrocodone

## 2020-03-08 ENCOUNTER — Ambulatory Visit (INDEPENDENT_AMBULATORY_CARE_PROVIDER_SITE_OTHER): Payer: No Typology Code available for payment source | Admitting: Psychology

## 2020-03-08 DIAGNOSIS — F411 Generalized anxiety disorder: Secondary | ICD-10-CM | POA: Diagnosis not present

## 2020-03-23 ENCOUNTER — Other Ambulatory Visit (HOSPITAL_COMMUNITY): Payer: Self-pay | Admitting: Osteopathic Medicine

## 2020-03-23 ENCOUNTER — Encounter: Payer: Self-pay | Admitting: Osteopathic Medicine

## 2020-03-23 ENCOUNTER — Telehealth (INDEPENDENT_AMBULATORY_CARE_PROVIDER_SITE_OTHER): Payer: No Typology Code available for payment source | Admitting: Osteopathic Medicine

## 2020-03-23 VITALS — Ht 72.0 in | Wt 221.0 lb

## 2020-03-23 DIAGNOSIS — F411 Generalized anxiety disorder: Secondary | ICD-10-CM | POA: Diagnosis not present

## 2020-03-23 DIAGNOSIS — Z9189 Other specified personal risk factors, not elsewhere classified: Secondary | ICD-10-CM | POA: Diagnosis not present

## 2020-03-23 DIAGNOSIS — F5105 Insomnia due to other mental disorder: Secondary | ICD-10-CM | POA: Diagnosis not present

## 2020-03-23 DIAGNOSIS — F410 Panic disorder [episodic paroxysmal anxiety] without agoraphobia: Secondary | ICD-10-CM | POA: Diagnosis not present

## 2020-03-23 MED ORDER — DULOXETINE HCL 40 MG PO CPEP: ORAL_CAPSULE | ORAL | 1 refills | Status: DC

## 2020-03-23 MED ORDER — TRAZODONE HCL 150 MG PO TABS: 150.0000 mg | ORAL_TABLET | Freq: Every evening | ORAL | 1 refills | Status: DC | PRN

## 2020-03-23 NOTE — Progress Notes (Signed)
Cymbalta - first week made him "spacey/tired"  Feels better now  PHQ9 (3) -GAD7 (5) completed.

## 2020-03-23 NOTE — Progress Notes (Signed)
Virtual Visit via Video (App used: Doximity) Note  I connected with      [name] on 03/23/20 at 10:32 AM  by a telemedicine application and verified that I am speaking with the correct person using two identifiers.  Patient is in separate location from physician  I am in office   I discussed the limitations of evaluation and management by telemedicine and the availability of in person appointments. The patient expressed understanding and agreed to proceed.  History of Present Illness: Klye Besecker is a 44 y.o. male who would like to discuss mental health follow-up.   TODAY 03/23/20:  First week on increased dose made him feel a bit spacey / tired, feeling better at this time. PHQ/GAD as below shows some improvement in symptom severity especially w/ anxiety  --> Continue current medications with Cymbalta and trazodone, recheck in about 3 months/sooner if needed   02/24/20:  Had first session with therapist yesterday  Still having some anxiety symptoms but the Cymbalta 20 mg has been helping  --> increase Cymbalta to 40 mg    02/10/2020:  "Visit to behavioral health w/ 3 days worsening anxiety, panic attacks - notes from 01/27/2020 reviewed, he did not meet criteria for inpatient admission and was referred to [facility]. He reports anxiety worsening, dad had MI few months ago, work has been stressful and he's changed jobs which has helped, famaily life is ok, nothing in particular triggering worsening symptoms but generally more anxiety lately. Feels he'd benefit from Rx/counseling.  "Has been on Lexapro which caused sexual side effects, Trintellix caused headaches, Wellbutrin also not tolerated, Cymbalta seemed to work ok but he stopped it awhile back." --> restarted Cymbalta at 20 mg w/ plan to titrate up to 40 mg  --> added Trazodone 150 mg qhs for sleep  --> referral for counseling     Depression screen [facility] 2/9 03/23/2020 02/24/2020 02/10/2020  Decreased Interest 0 0 1   Down, Depressed, Hopeless 0 0 0  PHQ - 2 Score 0 0 1  Altered sleeping 0 1 3  Tired, decreased energy 1 0 0  Change in appetite 0 0 1  Feeling bad or failure about yourself  0 0 0  Trouble concentrating 1 0 1  Moving slowly or fidgety/restless 1 0 0  Suicidal thoughts 0 0 0  PHQ-9 Score 3 1 6   Difficult doing work/chores Not difficult at all Somewhat difficult Somewhat difficult   GAD 7 : Generalized Anxiety Score 03/23/2020 02/24/2020 02/10/2020 11/17/2019  Nervous, Anxious, on Edge 1 2 3 2   Control/stop worrying 1 2 3 2   Worry too much - different things 1 2 3 2   Trouble relaxing 1 2 3 2   Restless 0 1 2 1   Easily annoyed or irritable 1 1 1  0  Afraid - awful might happen 0 1 1 1   Total GAD 7 Score 5 11 16 10   Anxiety Difficulty Not difficult at all Somewhat difficult Somewhat difficult Somewhat difficult      Observations/Objective: Ht 6' (1.829 m)   Wt 221 lb (100.2 kg)   BMI 29.97 kg/m  BP Readings from Last 3 Encounters:  11/17/19 110/67  05/13/19 130/81  04/29/19 127/76   Exam: Normal Speech.  NAD  Lab and Radiology Results No results found for this or any previous visit (from the past 72 hour(s)). No results found.     Assessment and Plan: 44 y.o. male with The primary encounter diagnosis was Generalized anxiety disorder with panic  attacks. Diagnoses of At risk for obstructive sleep apnea and Insomnia due to mental condition were also pertinent to this visit.  Stable, improved, continue Cymbalta and trazodone and therapy, increase   PDMP not reviewed this encounter. Orders Placed This Encounter  Procedures  . Ambulatory referral to Sleep Studies    Referral Priority:   Routine    Referral Type:   Consultation    Referral Reason:   Specialty Services Required    Number of Visits Requested:   1  . Home sleep test    Order Specific Question:   Where should this test be performed:    Answer:   [facility]   Meds ordered this encounter   Medications  . DULoxetine 40 MG CPEP    Sig: 40 mg daily    Dispense:  90 capsule    Refill:  1  . traZODone (DESYREL) 150 MG tablet    Sig: Take 1 tablet (150 mg total) by mouth at bedtime as needed for sleep.    Dispense:  90 tablet    Refill:  1   There are no Patient Instructions on file for this visit.    Follow Up Instructions: No follow-ups on file.    I discussed the assessment and treatment plan with the patient. The patient was provided an opportunity to ask questions and all were answered. The patient agreed with the plan and demonstrated an understanding of the instructions.   The patient was advised to call back or seek an in-person evaluation if any new concerns, if symptoms worsen or if the condition fails to improve as anticipated.  20 minutes of non-face-to-face time was provided during this encounter.      . . . . . . . . . . . . . Marland Kitchen                   Historical information moved to improve visibility of documentation.  No past medical history on file. Past Surgical History:  Procedure Laterality Date  . CHOLECYSTECTOMY    . ENDOVENOUS ABLATION SAPHENOUS VEIN W/ LASER Right 04/29/2019   endovenous laser ablation right greater saphenous vein and stab phlebectomy 10-20 incisions right leg by Cari Caraway MD    Social History   Tobacco Use  . Smoking status: Never Smoker  . Smokeless tobacco: Never Used  Substance Use Topics  . Alcohol use: Never   family history includes Cancer in his paternal grandmother; Diabetes in his maternal aunt and maternal grandmother; Heart attack in his maternal grandmother; Hypertension in his father; Stroke in his maternal grandmother.  Medications: Current Outpatient Medications  Medication Sig Dispense Refill  . DULoxetine (CYMBALTA) 20 MG capsule Take 1 capsule (20 mg total) by mouth daily for 14 days, THEN 2 capsules (40 mg total) daily. (Patient taking differently: 40 mg daily) 90  capsule 0  . fexofenadine (ALLEGRA) 180 MG tablet Take 180 mg daily by mouth.    . Multiple Vitamin (MULTIVITAMIN) tablet Take 1 tablet daily by mouth.    . traZODone (DESYREL) 150 MG tablet Take 1 tablet (150 mg total) by mouth at bedtime as needed for sleep. 90 tablet 0   No current facility-administered medications for this visit.   Allergies  Allergen Reactions  . Hydrocodone

## 2020-03-30 ENCOUNTER — Ambulatory Visit: Payer: No Typology Code available for payment source | Admitting: Psychology

## 2020-03-30 MED FILL — DULOXETINE HCL 20 MG CPEP: 20 | 90 days supply | Qty: 180 | Fill #0

## 2020-04-03 ENCOUNTER — Ambulatory Visit (INDEPENDENT_AMBULATORY_CARE_PROVIDER_SITE_OTHER): Payer: No Typology Code available for payment source | Admitting: Psychology

## 2020-04-03 DIAGNOSIS — F411 Generalized anxiety disorder: Secondary | ICD-10-CM

## 2020-05-01 ENCOUNTER — Ambulatory Visit (INDEPENDENT_AMBULATORY_CARE_PROVIDER_SITE_OTHER): Payer: No Typology Code available for payment source | Admitting: Psychology

## 2020-05-01 DIAGNOSIS — F411 Generalized anxiety disorder: Secondary | ICD-10-CM

## 2020-05-15 ENCOUNTER — Other Ambulatory Visit: Payer: Self-pay | Admitting: Osteopathic Medicine

## 2020-05-15 ENCOUNTER — Other Ambulatory Visit: Payer: Self-pay

## 2020-05-15 MED ORDER — TRAZODONE HCL 150 MG PO TABS: 150.0000 mg | ORAL_TABLET | Freq: Every evening | ORAL | 1 refills | Status: DC | PRN

## 2020-05-15 MED ORDER — LORAZEPAM 1 MG PO TABS: 0.5000 mg | ORAL_TABLET | Freq: Two times a day (BID) | ORAL | 0 refills | Status: DC | PRN

## 2020-05-15 MED ORDER — DULOXETINE HCL 40 MG PO CPEP: ORAL_CAPSULE | ORAL | 1 refills | Status: DC

## 2020-05-15 MED FILL — LORazepam 1 MG TABS: 1 | 5 days supply | Qty: 10 | Fill #0

## 2020-05-15 MED FILL — traZODone HCL 150 MG TABS: 150 | 90 days supply | Qty: 90 | Fill #0

## 2020-05-15 NOTE — Progress Notes (Signed)
For flying anxiety

## 2020-05-30 ENCOUNTER — Ambulatory Visit: Payer: No Typology Code available for payment source | Admitting: Psychology

## 2020-06-14 ENCOUNTER — Ambulatory Visit: Payer: No Typology Code available for payment source | Admitting: Psychology

## 2020-06-27 ENCOUNTER — Telehealth (INDEPENDENT_AMBULATORY_CARE_PROVIDER_SITE_OTHER): Payer: No Typology Code available for payment source | Admitting: Osteopathic Medicine

## 2020-06-27 ENCOUNTER — Encounter: Payer: Self-pay | Admitting: Osteopathic Medicine

## 2020-06-27 DIAGNOSIS — Z5329 Procedure and treatment not carried out because of patient's decision for other reasons: Secondary | ICD-10-CM

## 2020-06-27 NOTE — Progress Notes (Signed)
Late cancel

## 2020-07-10 ENCOUNTER — Other Ambulatory Visit: Payer: Self-pay

## 2020-07-10 DIAGNOSIS — F5101 Primary insomnia: Secondary | ICD-10-CM

## 2020-07-10 DIAGNOSIS — R0683 Snoring: Secondary | ICD-10-CM

## 2020-07-10 DIAGNOSIS — Z9189 Other specified personal risk factors, not elsewhere classified: Secondary | ICD-10-CM

## 2020-07-10 MED FILL — DULOXETINE HCL 20 MG CPEP: 20 | 90 days supply | Qty: 180 | Fill #1

## 2020-07-10 NOTE — Progress Notes (Signed)
Per Driscilla Grammes at [facility] - patient has not been scheduled because previous order was finalized. New order entered for pt to be scheduled and evaluated.

## 2020-08-14 MED FILL — traZODone HCL 150 MG TABS: 150 | 90 days supply | Qty: 90 | Fill #1

## 2020-09-01 ENCOUNTER — Other Ambulatory Visit (HOSPITAL_BASED_OUTPATIENT_CLINIC_OR_DEPARTMENT_OTHER): Payer: Self-pay

## 2020-09-11 ENCOUNTER — Other Ambulatory Visit: Payer: Self-pay

## 2020-09-11 ENCOUNTER — Ambulatory Visit (HOSPITAL_BASED_OUTPATIENT_CLINIC_OR_DEPARTMENT_OTHER): Payer: No Typology Code available for payment source | Admitting: Internal Medicine

## 2020-09-11 VITALS — Ht 72.0 in | Wt 232.0 lb

## 2020-09-11 DIAGNOSIS — Z9189 Other specified personal risk factors, not elsewhere classified: Secondary | ICD-10-CM

## 2020-09-11 DIAGNOSIS — R0683 Snoring: Secondary | ICD-10-CM

## 2020-09-11 DIAGNOSIS — F5101 Primary insomnia: Secondary | ICD-10-CM

## 2020-09-25 ENCOUNTER — Encounter (HOSPITAL_BASED_OUTPATIENT_CLINIC_OR_DEPARTMENT_OTHER): Payer: No Typology Code available for payment source | Admitting: Internal Medicine

## 2020-09-28 ENCOUNTER — Other Ambulatory Visit: Payer: Self-pay

## 2020-09-28 ENCOUNTER — Ambulatory Visit (HOSPITAL_BASED_OUTPATIENT_CLINIC_OR_DEPARTMENT_OTHER): Payer: No Typology Code available for payment source | Attending: Osteopathic Medicine | Admitting: Internal Medicine

## 2020-09-28 DIAGNOSIS — F5101 Primary insomnia: Secondary | ICD-10-CM | POA: Diagnosis not present

## 2020-09-28 DIAGNOSIS — R0902 Hypoxemia: Secondary | ICD-10-CM | POA: Diagnosis not present

## 2020-09-28 DIAGNOSIS — G4733 Obstructive sleep apnea (adult) (pediatric): Secondary | ICD-10-CM | POA: Insufficient documentation

## 2020-09-28 DIAGNOSIS — R0683 Snoring: Secondary | ICD-10-CM | POA: Diagnosis not present

## 2020-09-28 DIAGNOSIS — Z9189 Other specified personal risk factors, not elsewhere classified: Secondary | ICD-10-CM

## 2020-10-07 DIAGNOSIS — R0683 Snoring: Secondary | ICD-10-CM

## 2020-10-07 DIAGNOSIS — F5101 Primary insomnia: Secondary | ICD-10-CM

## 2020-10-07 DIAGNOSIS — Z9189 Other specified personal risk factors, not elsewhere classified: Secondary | ICD-10-CM | POA: Diagnosis not present

## 2020-10-07 NOTE — Procedures (Signed)
    Patient Name: Marc Wiggins, Marc Wiggins Date: 09/28/2020 Gender: Male D.O.B: 1975-12-20 Age (years): 45 Referring Provider: [name] Height (inches): 72 Interpreting Physician: Jetty Duhamel MD, ABSM Weight (lbs): 232 RPSGT: Coleman Sink BMI: 31 MRN: [id number] Neck Size: 17.50  CLINICAL INFORMATION Sleep Study Type: HST Indication for sleep study: OSA Epworth Sleepiness Score: 1  SLEEP STUDY TECHNIQUE A multi-channel overnight portable sleep study was performed. The channels recorded were: nasal airflow, thoracic respiratory movement, and oxygen saturation with a pulse oximetry. Snoring was also monitored.  MEDICATIONS Patient self administered medications include: none reported.  SLEEP ARCHITECTURE Patient was studied for 454.1 minutes. The sleep efficiency was 100.0 % and the patient was supine for 97.3%. The arousal index was 0.0 per hour.  RESPIRATORY PARAMETERS The overall AHI was 65.9 per hour, with a central apnea index of 0 per hour. The oxygen nadir was 69% during sleep.  CARDIAC DATA Mean heart rate during sleep was 74.0 bpm.  IMPRESSIONS - Severe obstructive sleep apnea occurred during this study (AHI = 65.9/h). - Oxygen desaturation was noted during this study (Min O2 = 69%). Mean O2 saturation 90%. - Time with O2 saturation 89% or less was 126 minutes. - Patient snored.  DIAGNOSIS - Obstructive Sleep Apnea (G47.33) - Nocturnal Hypoxemia (G47.36)  RECOMMENDATIONS - Suggest CPAP titration sleep study, which will include documentation needed for insurance coverage if supplemental oxygen is also appropriate. - Be careful with alcohol, sedatives and other CNS depressants that may worsen sleep apnea and disrupt normal sleep architecture. - Sleep hygiene should be reviewed to assess factors that may improve sleep quality. - Weight management and regular exercise should be initiated or continued.  [Electronically signed] 10/07/2020 11:44  AM  Jetty Duhamel MD, ABSM Diplomate, American Board of Sleep Medicine   NPI: [id number]                         Jetty Duhamel Diplomate, American Board of Sleep Medicine  ELECTRONICALLY SIGNED ON:  10/07/2020, 11:41 AM [facility] PH: [phone]   FX: [phone] ACCREDITED BY THE AMERICAN ACADEMY OF SLEEP MEDICINE

## 2020-10-09 ENCOUNTER — Other Ambulatory Visit: Payer: Self-pay

## 2020-10-09 ENCOUNTER — Other Ambulatory Visit (HOSPITAL_COMMUNITY): Payer: Self-pay

## 2020-10-09 DIAGNOSIS — G4730 Sleep apnea, unspecified: Secondary | ICD-10-CM | POA: Insufficient documentation

## 2020-10-09 DIAGNOSIS — G4733 Obstructive sleep apnea (adult) (pediatric): Secondary | ICD-10-CM | POA: Insufficient documentation

## 2020-10-09 MED ORDER — DULOXETINE HCL 20 MG PO CPEP
40.0000 mg | ORAL_CAPSULE | Freq: Every day | ORAL | 0 refills | Status: DC
  Filled 2020-10-09: qty 180, 90d supply, fill #0

## 2020-10-09 MED ORDER — AMBULATORY NON FORMULARY MEDICATION: 99 refills | Status: DC

## 2020-10-09 NOTE — Progress Notes (Signed)
See result note.  

## 2020-10-09 NOTE — Progress Notes (Signed)
Sleep test showed severe sleep apnea Sending referral for formal sleep study

## 2020-10-10 ENCOUNTER — Other Ambulatory Visit (HOSPITAL_COMMUNITY): Payer: Self-pay

## 2020-10-11 ENCOUNTER — Other Ambulatory Visit (HOSPITAL_COMMUNITY): Payer: Self-pay

## 2020-10-24 ENCOUNTER — Telehealth: Payer: Self-pay

## 2020-10-24 NOTE — Telephone Encounter (Signed)
Pt called requesting his sleep study results. Wants to know what the plan of care will be. Please contact the patient to schedule an appt with provider. Thanks in advance.

## 2020-10-24 NOTE — Telephone Encounter (Signed)
Patient has been scheduled for 10/31/20. AM

## 2020-10-25 NOTE — Progress Notes (Signed)
See sleep study 

## 2020-10-31 ENCOUNTER — Telehealth (INDEPENDENT_AMBULATORY_CARE_PROVIDER_SITE_OTHER): Payer: No Typology Code available for payment source | Admitting: Osteopathic Medicine

## 2020-10-31 ENCOUNTER — Encounter: Payer: Self-pay | Admitting: Osteopathic Medicine

## 2020-10-31 VITALS — Wt 236.0 lb

## 2020-10-31 DIAGNOSIS — G4730 Sleep apnea, unspecified: Secondary | ICD-10-CM

## 2020-10-31 NOTE — Progress Notes (Signed)
Telemedicine Visit via  Video & Audio (App used: MyChar  I connected with [name] on 10/31/20 at 1:21 PM  by phone or  telemedicine application as noted above  I verified that I am speaking with or regarding  the correct patient using two identifiers.  Participants: Myself, Dr [name] DO Patient: Marc Wiggins Patient proxy if applicable: none Other, if applicable: none  Patient is at home I am in office at [facility]    I discussed the limitations of evaluation and management  by telemedicine and the availability of in person appointments.  The participant(s) above expressed understanding and  agreed to proceed with this appointment via telemedicine.       History of Present Illness: Marc Wiggins is a 45 y.o. male who would like to discuss sleep study      Observations/Objective: Wt 236 lb (107 kg)   BMI 32.01 kg/m  BP Readings from Last 3 Encounters:  11/17/19 110/67  05/13/19 130/81  04/29/19 127/76   Exam: Normal Speech.  NAD  Lab and Radiology Results No results found for this or any previous visit (from the past 72 hour(s)). No results found.   Home sleep study:  IMPRESSIONS - Severe obstructive sleep apnea occurred during this study (AHI = 65.9/h). - Oxygen desaturation was noted during this study (Min O2 = 69%). Mean O2 saturation 90%. - Time with O2 saturation 89% or less was 126 minutes. - Patient snored.  DIAGNOSIS - Obstructive Sleep Apnea (G47.33) - Nocturnal Hypoxemia (G47.36)  RECOMMENDATIONS - Suggest CPAP titration sleep study, which will include documentation needed for insurance coverage if supplemental oxygen is also appropriate. - Be careful with alcohol, sedatives and other CNS depressants that may worsen sleep apnea and disrupt normal sleep architecture. - Sleep hygiene should be reviewed to assess factors that may improve sleep quality. - Weight management and regular exercise should  be initiated or continued.   Assessment and Plan: 45 y.o. male with The encounter diagnosis was Severe sleep apnea.  Severe OSA based on home sleep study  Recommended CPAP titration Pt had some questions about other devices / interventions Referral placed to specialty to discuss options / next steps   PDMP not reviewed this encounter. Orders Placed This Encounter  Procedures  . Ambulatory referral to Pulmonology    Referral Priority:   Routine    Referral Type:   Consultation    Referral Reason:   Specialty Services Required    Referred to Provider:   Waymon Budge, MD    Requested Specialty:   Pulmonary Disease    Number of Visits Requested:   1   No orders of the defined types were placed in this encounter.  There are no Patient Instructions on file for this visit.  Instructions sent via MyChart.   Follow Up Instructions: when due for annual    I discussed the assessment and treatment plan with the patient. The patient was provided an opportunity to ask questions and all were answered. The patient agreed with the plan and demonstrated an understanding of the instructions.   The patient was advised to call back or seek an in-person evaluation if any new concerns, if symptoms worsen or if the condition fails to improve as anticipated.  10 minutes of non-face-to-face time was provided during this encounter.      . . . . . . . . . . . . . Marland Kitchen  Historical information moved to improve visibility of documentation.  No past medical history on file. Past Surgical History:  Procedure Laterality Date  . CHOLECYSTECTOMY    . ENDOVENOUS ABLATION SAPHENOUS VEIN W/ LASER Right 04/29/2019   endovenous laser ablation right greater saphenous vein and stab phlebectomy 10-20 incisions right leg by Cari Caraway MD    Social History   Tobacco Use  . Smoking status: Never Smoker  . Smokeless tobacco: Never Used  Substance Use Topics   . Alcohol use: Never   family history includes Cancer in his paternal grandmother; Diabetes in his maternal aunt and maternal grandmother; Heart attack in his maternal grandmother; Hypertension in his father; Stroke in his maternal grandmother.  Medications: Current Outpatient Medications  Medication Sig Dispense Refill  . AMBULATORY NON FORMULARY MEDICATION Supply ordered: CPAP and other supplies needed (headgear, cushions, filters, heated tuubing and water chamber) Dx: obstructive sleep apnea Settings: auto-titration 5-20 cmH2O 1 Units 99  . DULoxetine (CYMBALTA) 20 MG capsule TAKE 2 CAPSULES BY MOUTH ONCE A DAY 180 capsule 1  . DULoxetine (CYMBALTA) 20 MG capsule Take 2 capsules (40 mg total) by mouth daily. 180 capsule 0  . fexofenadine (ALLEGRA) 180 MG tablet Take 180 mg daily by mouth.    Marland Kitchen LORazepam (ATIVAN) 1 MG tablet TAKE 1/2 TO 1 TABLET BY MOUTH 2 TIMES DAILY AS NEEDED FOR ANXIETY 10 tablet 0  . Multiple Vitamin (MULTIVITAMIN) tablet Take 1 tablet daily by mouth.    . traZODone (DESYREL) 150 MG tablet TAKE 1 TABLET BY MOUTH AT BEDTIME AS NEEDED FOR SLEEP (Patient taking differently: Take by mouth at bedtime as needed. for sleep) 90 tablet 1   No current facility-administered medications for this visit.   Allergies  Allergen Reactions  . Hydrocodone      If phone visit, billing and coding can please add appropriate modifier if needed

## 2020-11-13 ENCOUNTER — Encounter: Payer: Self-pay | Admitting: Osteopathic Medicine

## 2020-11-14 ENCOUNTER — Other Ambulatory Visit (HOSPITAL_COMMUNITY): Payer: Self-pay

## 2020-11-14 MED ORDER — TRAZODONE HCL 150 MG PO TABS
150.0000 mg | ORAL_TABLET | Freq: Every evening | ORAL | 1 refills | Status: DC | PRN
  Filled 2020-11-14: qty 90, 90d supply, fill #0

## 2020-12-07 ENCOUNTER — Ambulatory Visit (INDEPENDENT_AMBULATORY_CARE_PROVIDER_SITE_OTHER): Payer: No Typology Code available for payment source | Admitting: Osteopathic Medicine

## 2020-12-07 ENCOUNTER — Other Ambulatory Visit (HOSPITAL_COMMUNITY): Payer: Self-pay

## 2020-12-07 ENCOUNTER — Other Ambulatory Visit: Payer: Self-pay

## 2020-12-07 VITALS — BP 133/83 | HR 98 | Ht 69.5 in | Wt 247.0 lb

## 2020-12-07 DIAGNOSIS — Z0000 Encounter for general adult medical examination without abnormal findings: Secondary | ICD-10-CM | POA: Diagnosis not present

## 2020-12-07 DIAGNOSIS — F3342 Major depressive disorder, recurrent, in full remission: Secondary | ICD-10-CM

## 2020-12-07 DIAGNOSIS — G4730 Sleep apnea, unspecified: Secondary | ICD-10-CM

## 2020-12-07 DIAGNOSIS — F411 Generalized anxiety disorder: Secondary | ICD-10-CM

## 2020-12-07 DIAGNOSIS — F5105 Insomnia due to other mental disorder: Secondary | ICD-10-CM

## 2020-12-07 MED ORDER — TRAZODONE HCL 150 MG PO TABS
150.0000 mg | ORAL_TABLET | Freq: Every evening | ORAL | 3 refills | Status: DC | PRN
  Filled 2020-12-07 – 2021-02-16 (×2): qty 90, 90d supply, fill #0
  Filled 2021-05-28: qty 90, 90d supply, fill #1
  Filled 2021-08-24: qty 90, 90d supply, fill #2
  Filled 2021-11-29: qty 90, 90d supply, fill #3

## 2020-12-07 MED ORDER — FEXOFENADINE HCL 180 MG PO TABS
180.0000 mg | ORAL_TABLET | Freq: Every day | ORAL | 3 refills | Status: DC
  Filled 2020-12-07: qty 90, 90d supply, fill #0

## 2020-12-07 MED ORDER — DULOXETINE HCL 20 MG PO CPEP
40.0000 mg | ORAL_CAPSULE | Freq: Every day | ORAL | 3 refills | Status: DC
  Filled 2020-12-07 – 2021-01-10 (×2): qty 180, 90d supply, fill #0
  Filled 2021-04-13: qty 180, 90d supply, fill #1
  Filled 2021-07-16: qty 180, 90d supply, fill #2
  Filled 2021-10-16 – 2021-10-17 (×2): qty 180, 90d supply, fill #3

## 2020-12-07 NOTE — Progress Notes (Signed)
Marc Wiggins is a 45 y.o. male who presents to  [facility] at [facility]  today, 12/07/20, seeking care for the following:  Annual physical      ASSESSMENT & PLAN with other pertinent findings:  The primary encounter diagnosis was Annual physical exam. Diagnoses of Severe sleep apnea, Generalized anxiety disorder, Recurrent major depressive disorder, in full remission (HCC), and Insomnia due to mental condition were also pertinent to this visit.    Patient Instructions  General Preventive Care Most recent routine screening labs: ordered.  Blood pressure goal 130/80 or less.  Tobacco: don't!  Alcohol: responsible moderation is ok for most adults - if you have concerns about your alcohol intake, please talk to me!  Exercise: as tolerated to reduce risk of cardiovascular disease and diabetes. Strength training will also prevent osteoporosis.  Mental health: if need for mental health care (medicines, counseling, other), or concerns about moods, please let me know!  Sexual / Reproductive health: if need for STI testing, or if concerns with libido/pain problems, please let me know!  Advanced Directive: Living Will and/or Healthcare Power of Attorney recommended for all adults, regardless of age or health.  Vaccines Flu vaccine: for almost everyone, every fall.  Shingles vaccine: after age 63.  Pneumonia vaccines: after age 73. Tetanus booster: every 10 years - due 2030 COVID vaccine: STRONGLY RECOMMENDED Cancer screenings  Colon cancer screening: for everyone age 57-75.  Prostate cancer screening: PSA blood test age 45-71 Lung cancer screening: not needed for non-smokers Infection screenings  HIV: recommended screening at least once age 25-65, more often as needed. Gonorrhea/Chlamydia: screening as needed Hepatitis C: recommended once for everyone age 59-75 TB: certain at-risk populations, or depending on work requirements and/or  travel history Orders Placed This Encounter  Procedures   CBC   COMPLETE METABOLIC PANEL WITH GFR   Lipid panel   TSH    Meds ordered this encounter  Medications   DULoxetine (CYMBALTA) 20 MG capsule    Sig: Take 2 capsules (40 mg total) by mouth daily.    Dispense:  180 capsule    Refill:  3   fexofenadine (ALLEGRA) 180 MG tablet    Sig: Take 1 tablet (180 mg total) by mouth daily.    Dispense:  90 tablet    Refill:  3   traZODone (DESYREL) 150 MG tablet    Sig: Take 1 tablet (150 mg total) by mouth at bedtime as needed for sleep    Dispense:  90 tablet    Refill:  3     See below for relevant physical exam findings  See below for recent lab and imaging results reviewed  Medications, allergies, PMH, PSH, SocH, FamH reviewed below    Follow-up instructions: Return in about 1 year (around 12/07/2021) for Marc Wiggins (call week prior to visit for lab orders).                                        Exam:  BP 133/83   Pulse 98   Ht 5' 9.5" (1.765 m)   Wt 247 lb (112 kg)   SpO2 98%   BMI 35.95 kg/m  Constitutional: VS see above. General Appearance: alert, well-developed, well-nourished, NAD Neck: No masses, trachea midline.  Respiratory: Normal respiratory effort. no wheeze, no rhonchi, no rales Cardiovascular: S1/S2 normal, no murmur, no rub/gallop auscultated. RRR.  Musculoskeletal:  Gait normal. Symmetric and independent movement of all extremities Abdominal: non-tender, non-distended, no appreciable organomegaly, neg Murphy's, BS WNLx4 Neurological: Normal balance/coordination. No tremor. Skin: warm, dry, intact.  Psychiatric: Normal judgment/insight. Normal mood and affect. Oriented x3.   Current Meds  Medication Sig   AMBULATORY NON FORMULARY MEDICATION Supply ordered: CPAP and other supplies needed (headgear, cushions, filters, heated tuubing and water chamber) Dx: obstructive sleep apnea Settings: auto-titration 5-20 cmH2O    Multiple Vitamin (MULTIVITAMIN) tablet Take 1 tablet daily by mouth.   [DISCONTINUED] DULoxetine (CYMBALTA) 20 MG capsule Take 2 capsules (40 mg total) by mouth daily.   [DISCONTINUED] fexofenadine (ALLEGRA) 180 MG tablet Take 180 mg daily by mouth.   [DISCONTINUED] traZODone (DESYREL) 150 MG tablet Take 1 tablet (150 mg total) by mouth at bedtime as needed.for sleep    Allergies  Allergen Reactions   Hydrocodone     Patient Active Problem List   Diagnosis Date Noted   Severe sleep apnea 10/09/2020   Current moderate episode of major depressive disorder without prior episode (HCC) 06/29/2018   Insomnia due to mental condition 06/29/2018   Generalized anxiety disorder 04/23/2017    Family History  Problem Relation Age of Onset   Hypertension Father    Diabetes Maternal Aunt    Stroke Maternal Grandmother    Diabetes Maternal Grandmother    Heart attack Maternal Grandmother    Cancer Paternal Grandmother     Social History   Tobacco Use  Smoking Status Never  Smokeless Tobacco Never    Past Surgical History:  Procedure Laterality Date   CHOLECYSTECTOMY     ENDOVENOUS ABLATION SAPHENOUS VEIN W/ LASER Right 04/29/2019   endovenous laser ablation right greater saphenous vein and stab phlebectomy 10-20 incisions right leg by Cari Caraway MD     Immunization History  Administered Date(s) Administered   Influenza,inj,Quad PF,6+ Mos 08/27/2018, 03/26/2019   Tdap 11/17/2018    Recent Results (from the past 2160 hour(s))  CBC     Status: None   Collection Time: 12/07/20 12:00 AM  Result Value Ref Range   WBC 8.3 3.8 - 10.8 Thousand/uL   RBC 4.63 4.20 - 5.80 Million/uL   Hemoglobin 14.8 13.2 - 17.1 g/dL   HCT 81.8 56.3 - 14.9 %   MCV 97.0 80.0 - 100.0 fL   MCH 32.0 27.0 - 33.0 pg   MCHC 33.0 32.0 - 36.0 g/dL   RDW 70.2 63.7 - 85.8 %   Platelets 260 140 - 400 Thousand/uL   MPV 10.4 7.5 - 12.5 fL  COMPLETE METABOLIC PANEL WITH GFR     Status: None   Collection Time:  12/07/20 12:00 AM  Result Value Ref Range   Glucose, Bld 79 65 - 99 mg/dL    Comment: .            Fasting reference interval .    BUN 19 7 - 25 mg/dL   Creat 8.50 2.77 - 4.12 mg/dL   GFR, Est Non African American 73 > OR = 60 mL/min/1.65m2   GFR, Est African American 84 > OR = 60 mL/min/1.34m2   BUN/Creatinine Ratio NOT APPLICABLE 6 - 22 (calc)   Sodium 141 135 - 146 mmol/L   Potassium 4.4 3.5 - 5.3 mmol/L   Chloride 104 98 - 110 mmol/L   CO2 28 20 - 32 mmol/L   Calcium 9.4 8.6 - 10.3 mg/dL   Total Protein 6.7 6.1 - 8.1 g/dL   Albumin 4.3 3.6 - 5.1 g/dL  Globulin 2.4 1.9 - 3.7 g/dL (calc)   AG Ratio 1.8 1.0 - 2.5 (calc)   Total Bilirubin 0.4 0.2 - 1.2 mg/dL   Alkaline phosphatase (APISO) 76 36 - 130 U/L   AST 27 10 - 40 U/L   ALT 26 9 - 46 U/L  Lipid panel     Status: Abnormal   Collection Time: 12/07/20 12:00 AM  Result Value Ref Range   Cholesterol 202 (H) <200 mg/dL   HDL 78 > OR = 40 mg/dL   Triglycerides 637 <858 mg/dL   LDL Cholesterol (Calc) 102 (H) mg/dL (calc)    Comment: Reference range: <100 . Desirable range <100 mg/dL for primary prevention;   <70 mg/dL for patients with CHD or diabetic patients  with > or = 2 CHD risk factors. Marland Kitchen LDL-C is now calculated using the Martin-Hopkins  calculation, which is a validated novel method providing  better accuracy than the Friedewald equation in the  estimation of LDL-C.  Horald Pollen et al. Lenox Ahr. 8502;774(12): 2061-2068  (http://education.[employer].com/faq/FAQ164)    Total CHOL/HDL Ratio 2.6 <5.0 (calc)   Non-HDL Cholesterol (Calc) 124 <130 mg/dL (calc)    Comment: For patients with diabetes plus 1 major ASCVD risk  factor, treating to a non-HDL-C goal of <100 mg/dL  (LDL-C of <87 mg/dL) is considered a therapeutic  option.   TSH     Status: None   Collection Time: 12/07/20 12:00 AM  Result Value Ref Range   TSH 0.90 0.40 - 4.50 mIU/L    No results found.     All questions at time of visit were  answered - patient instructed to contact office with any additional concerns or updates. ER/RTC precautions were reviewed with the patient as applicable.   Please note: manual typing as well as voice recognition software may have been used to produce this document - typos may escape review. Please contact Dr. Lyn Hollingshead for any needed clarifications.

## 2020-12-07 NOTE — Patient Instructions (Addendum)
General Preventive Care Most recent routine screening labs: ordered.  Blood pressure goal 130/80 or less.  Tobacco: don't!  Alcohol: responsible moderation is ok for most adults - if you have concerns about your alcohol intake, please talk to me!  Exercise: as tolerated to reduce risk of cardiovascular disease and diabetes. Strength training will also prevent osteoporosis.  Mental health: if need for mental health care (medicines, counseling, other), or concerns about moods, please let me know!  Sexual / Reproductive health: if need for STI testing, or if concerns with libido/pain problems, please let me know!  Advanced Directive: Living Will and/or Healthcare Power of Attorney recommended for all adults, regardless of age or health.  Vaccines Flu vaccine: for almost everyone, every fall.  Shingles vaccine: after age 13.  Pneumonia vaccines: after age 30. Tetanus booster: every 10 years - due 2030 COVID vaccine: STRONGLY RECOMMENDED Cancer screenings  Colon cancer screening: for everyone age 34-75.  Prostate cancer screening: PSA blood test age 82-71 Lung cancer screening: not needed for non-smokers Infection screenings  HIV: recommended screening at least once age 16-65, more often as needed. Gonorrhea/Chlamydia: screening as needed Hepatitis C: recommended once for everyone age 47-75 TB: certain at-risk populations, or depending on work requirements and/or travel history

## 2020-12-08 LAB — COMPLETE METABOLIC PANEL WITH GFR
AG Ratio: 1.8 (calc) (ref 1.0–2.5)
ALT: 26 U/L (ref 9–46)
AST: 27 U/L (ref 10–40)
Albumin: 4.3 g/dL (ref 3.6–5.1)
Alkaline phosphatase (APISO): 76 U/L (ref 36–130)
BUN: 19 mg/dL (ref 7–25)
CO2: 28 mmol/L (ref 20–32)
Calcium: 9.4 mg/dL (ref 8.6–10.3)
Chloride: 104 mmol/L (ref 98–110)
Creat: 1.21 mg/dL (ref 0.60–1.35)
GFR, Est African American: 84 mL/min/{1.73_m2} (ref >=60)
GFR, Est Non African American: 73 mL/min/{1.73_m2} (ref >=60)
Globulin: 2.4 g/dL (calc) (ref 1.9–3.7)
Glucose, Bld: 79 mg/dL (ref 65–99)
Potassium: 4.4 mmol/L (ref 3.5–5.3)
Sodium: 141 mmol/L (ref 135–146)
Total Bilirubin: 0.4 mg/dL (ref 0.2–1.2)
Total Protein: 6.7 g/dL (ref 6.1–8.1)

## 2020-12-08 LAB — CBC
HCT: 44.9 % (ref 38.5–50.0)
Hemoglobin: 14.8 g/dL (ref 13.2–17.1)
MCH: 32 pg (ref 27.0–33.0)
MCHC: 33 g/dL (ref 32.0–36.0)
MCV: 97 fL (ref 80.0–100.0)
MPV: 10.4 fL (ref 7.5–12.5)
Platelets: 260 10*3/uL (ref 140–400)
RBC: 4.63 10*6/uL (ref 4.20–5.80)
RDW: 11.8 % (ref 11.0–15.0)
WBC: 8.3 10*3/uL (ref 3.8–10.8)

## 2020-12-08 LAB — LIPID PANEL
Cholesterol: 202 mg/dL — ABNORMAL HIGH (ref <200)
HDL: 78 mg/dL (ref >=40)
LDL Cholesterol (Calc): 102 mg/dL (calc) — ABNORMAL HIGH
Non-HDL Cholesterol (Calc): 124 mg/dL (calc) (ref <130)
Total CHOL/HDL Ratio: 2.6 (calc) (ref <5.0)
Triglycerides: 121 mg/dL (ref <150)

## 2020-12-08 LAB — TSH: TSH: 0.9 mIU/L (ref 0.40–4.50)

## 2020-12-10 ENCOUNTER — Other Ambulatory Visit: Payer: Self-pay

## 2020-12-10 ENCOUNTER — Ambulatory Visit (HOSPITAL_BASED_OUTPATIENT_CLINIC_OR_DEPARTMENT_OTHER): Payer: No Typology Code available for payment source | Attending: Osteopathic Medicine | Admitting: Internal Medicine

## 2020-12-10 VITALS — Ht 72.0 in | Wt 243.0 lb

## 2020-12-10 DIAGNOSIS — G4733 Obstructive sleep apnea (adult) (pediatric): Secondary | ICD-10-CM | POA: Insufficient documentation

## 2020-12-10 DIAGNOSIS — G4730 Sleep apnea, unspecified: Secondary | ICD-10-CM | POA: Diagnosis present

## 2020-12-14 NOTE — Progress Notes (Signed)
12/15/20- 73 yoM never smoker for sleep evaluation courtesy of Dr [name] with concern of OSA and nocturnal hypoxemia. Medical problem list includes OSA, Insomnia, Anxiety Disorder, Depression HST 09/28/20- AHI 65.9/ hr, desaturation to 69% with 126 minutes </= 89%, body weight 232 lbs PAP titration 12/10/20- CPAP inadequate. Good control on BIPAP 22/18 with minO2 sat 96% Meds include Trazodone, Cymbalta Epworth score-3 Body weight today-245 lbs Covid vax--2 Phizer -----Gasping and limbs jerking at night, tired during the day Has lost weight without surgery, from peak 400 lbs. Snoring is some better. Wife has recorded him snoring and gasping. Both parents use CPAP. 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 Safe driving discussed.  No ENT surgery, heart, lung, thyroid or neurologic problem known.   Prior to Admission medications   Medication Sig Start Date End Date Taking? Authorizing Provider  AMBULATORY NON FORMULARY MEDICATION Supply ordered: CPAP and other supplies needed (headgear, cushions, filters, heated tuubing and water chamber) Dx: obstructive sleep apnea Settings: auto-titration 5-20 cmH2O 10/09/20  Yes [name], DO  DULoxetine (CYMBALTA) 20 MG capsule Take 2 capsules (40 mg total) by mouth daily. 12/07/20  Yes [name], DO  fexofenadine (ALLEGRA) 180 MG tablet Take 1 tablet (180 mg total) by mouth daily. 12/07/20  Yes [name], DO  Multiple Vitamin (MULTIVITAMIN) tablet Take 1 tablet daily by mouth.   Yes [provider]  traZODone (DESYREL) 150 MG tablet Take 1 tablet (150 mg total) by mouth at bedtime as needed for sleep 12/07/20 12/07/21 Yes [name], DO   History reviewed. No pertinent past medical history. Past Surgical History:  Procedure Laterality Date   CHOLECYSTECTOMY     ENDOVENOUS ABLATION SAPHENOUS VEIN W/ LASER Right 04/29/2019   endovenous laser ablation right greater saphenous vein and stab phlebectomy 10-20 incisions right leg  by Cari Caraway MD    Family History  Problem Relation Age of Onset   Hypertension Father    Diabetes Maternal Aunt    Stroke Maternal Grandmother    Diabetes Maternal Grandmother    Heart attack Maternal Grandmother    Cancer Paternal Grandmother    Social History   Socioeconomic History   Marital status: Married    Spouse name: Not on file   Number of children: Not on file   Years of education: Not on file   Highest education level: Not on file  Occupational History   Not on file  Tobacco Use   Smoking status: Never   Smokeless tobacco: Never  Vaping Use   Vaping Use: Never used  Substance and Sexual Activity   Alcohol use: Never   Drug use: Never   Sexual activity: Not on file  Other Topics Concern   Not on file  Social History Narrative   Not on file   Social Determinants of Health   Financial Resource Strain: Not on file  Food Insecurity: Not on file  Transportation Needs: Not on file  Physical Activity: Not on file  Stress: Not on file  Social Connections: Not on file  Intimate Partner Violence: Not on file   ROS-see HPI   + = positive Constitutional:    weight loss, night sweats, fevers, chills, fatigue, lassitude. HEENT:    headaches, difficulty swallowing, tooth/dental problems, sore throat,       sneezing, itching, ear ache, +nasal congestion, post nasal drip, snoring CV:    chest pain, orthopnea, PND, swelling in lower extremities, anasarca,       dizziness, palpitations Resp:   shortness of  breath with exertion or at rest.                productive cough,   non-productive cough, coughing up of blood.              change in color of mucus.  wheezing.   Skin:    rash or lesions. GI:  +heartburn, indigestion, abdominal pain, nausea, vomiting, diarrhea,                 change in bowel habits, loss of appetite GU: dysuria, change in color of urine, no urgency or frequency.   flank pain. MS:   joint pain, stiffness, decreased range of motion, back  pain. Neuro-     nothing unusual Psych:  change in mood or affect.  depression or anxiety.   memory loss.   OBJ- Physical Exam General- Alert, Oriented, Affect-appropriate, Distress- none acute, +Obese Skin- rash-none, lesions- none, excoriation- none Lymphadenopathy- none Head- atraumatic            Eyes- Gross vision intact, PERRLA, conjunctivae and secretions clear            Ears- Hearing, canals-normal            Nose- Clear, no-Septal dev, mucus, polyps, erosion, perforation             Throat- Mallampati IV , mucosa clear , drainage- none, tonsils- atrophic, + teeth Neck- flexible , trachea midline, no stridor , thyroid nl, carotid no bruit Chest - symmetrical excursion , unlabored           Heart/CV- RRR , no murmur , no gallop  , no rub, nl s1 s2                           - JVD- none , edema- none, stasis changes- none, varices- none           Lung- clear to P&A, wheeze- none, cough- none , dullness-none, rub- none           Chest wall-  Abd-  Br/ Gen/ Rectal- Not done, not indicated Extrem- cyanosis- none, clubbing, none, atrophy- none, strength- nl Neuro- grossly intact to observation

## 2020-12-15 ENCOUNTER — Other Ambulatory Visit: Payer: Self-pay

## 2020-12-15 ENCOUNTER — Ambulatory Visit (INDEPENDENT_AMBULATORY_CARE_PROVIDER_SITE_OTHER): Payer: No Typology Code available for payment source | Admitting: Internal Medicine

## 2020-12-15 ENCOUNTER — Encounter: Payer: Self-pay | Admitting: Internal Medicine

## 2020-12-15 VITALS — BP 122/80 | HR 76 | Temp 97.6°F | Ht 72.0 in | Wt 245.5 lb

## 2020-12-15 DIAGNOSIS — G4730 Sleep apnea, unspecified: Secondary | ICD-10-CM

## 2020-12-15 DIAGNOSIS — F5105 Insomnia due to other mental disorder: Secondary | ICD-10-CM | POA: Diagnosis not present

## 2020-12-15 DIAGNOSIS — G4733 Obstructive sleep apnea (adult) (pediatric): Secondary | ICD-10-CM

## 2020-12-15 NOTE — Assessment & Plan Note (Signed)
Severe OSA. CPAP was insufficient. Fortunately BIPAP corrected the nocturnal hypoxemia. Appropriate discussion of OSA, safe driving responsibility, treatment options. Questions answered. Plan- new BIPAP 22/18

## 2020-12-15 NOTE — Patient Instructions (Signed)
Order- new DME, new BIPAP 22/18, mask of choice, humidifier, supplies, Airview/ card  Please call as needed

## 2020-12-15 NOTE — Assessment & Plan Note (Signed)
If he needs some help with this, we will discuss as he adjusts to BIPAP. Meanwhile emphasis on basic sleep hygiene.

## 2020-12-24 DIAGNOSIS — G4730 Sleep apnea, unspecified: Secondary | ICD-10-CM

## 2020-12-24 NOTE — Procedures (Signed)
    Patient Name: Marc Wiggins, Marc Wiggins Date: 12/10/2020 Gender: Male D.O.B: July 28, 1975 Age (years): 45 Referring Provider: [name] Height (inches): 72 Interpreting Physician: Jetty Duhamel MD, ABSM Weight (lbs): 232 RPSGT: Lowry Ram BMI: 31 MRN: [id number] Neck Size: 16.50  CLINICAL INFORMATION The patient is referred for a BiPAP titration to treat sleep apnea.  Date of NPSG, Split Night or HST:  HST 09/28/20  AHI 65.9/ hr, desaturation to 69%, body weight 232 lbs  SLEEP STUDY TECHNIQUE As per the AASM Manual for the Scoring of Sleep and Associated Events v2.3 (April 2016) with a hypopnea requiring 4% desaturations.  The channels recorded and monitored were frontal, central and occipital EEG, electrooculogram (EOG), submentalis EMG (chin), nasal and oral airflow, thoracic and abdominal wall motion, anterior tibialis EMG, snore microphone, electrocardiogram, and pulse oximetry. Bilevel positive airway pressure (BPAP) was initiated at the beginning of the study and titrated to treat sleep-disordered breathing.  MEDICATIONS Medications self-administered by patient taken the night of the study : none reported  RESPIRATORY PARAMETERS Optimal IPAP Pressure (cm): 22 AHI at Optimal Pressure (/hr) 1 Optimal EPAP Pressure (cm): 18   Overall Minimal O2 (%): 78.0 Minimal O2 at Optimal Pressure (%): 95.0 SLEEP ARCHITECTURE Start Time: 10:25:11 PM Stop Time: 5:59:45 AM Total Time (min): 454.6 Total Sleep Time (min): 405.5 Sleep Latency (min): 6.9 Sleep Efficiency (%): 89.2% REM Latency (min): 151.5 WASO (min): 42.2 Stage N1 (%): 6.8% Stage N2 (%): 66.0% Stage N3 (%): 0.0% Stage R (%): 27.3 Supine (%): 23.43 Arousal Index (/hr): 12.6   CARDIAC DATA The 2 lead EKG demonstrated sinus rhythm. The mean heart rate was 58.6 beats per minute. Other EKG findings include: None.  LEG MOVEMENT DATA The total Periodic Limb Movements of Sleep (PLMS) were 0. The PLMS index was 0.0.  A PLMS index of <15 is considered normal in adults.  IMPRESSIONS - CPAP provided inadequate control at tolerated pressures and was changed to BIPAP titration. - An optimal BIPAP pressure was selected for this patient ( 22 /18 cm of water) - Central sleep apnea was not noted during this titration (CAI = 0.9/h). - Oxygen desaturations were observed during this titration (min O2 = 78.0%). Minimum O2 saturation on BIPAP 22/18 was 95%. - No snoring was audible during this study. - No cardiac abnormalities were observed during this study. - Total Limb Movements 364 ( 53.9/ hr). Limb Movements with arousal 15 ( 2.2/ hr)- likely not significant.  DIAGNOSIS - Obstructive Sleep Apnea (G47.33)  RECOMMENDATIONS - Trial of BiPAP therapy on 22/18 cm H2O. - Patient used a Medium size Resmed Full Face Mask AirFit F20 mask and heated humidification. - Be careful with alcohol, sedatives and other CNS depressants that may worsen sleep apnea and disrupt normal sleep architecture. - Sleep hygiene should be reviewed to assess factors that may improve sleep quality. - Weight management and regular exercise should be initiated or continued.  [Electronically signed] 12/24/2020 11:54 AM  Jetty Duhamel MD, ABSM Diplomate, American Board of Sleep Medicine   NPI: [id number]                         Jetty Duhamel Diplomate, American Board of Sleep Medicine  ELECTRONICALLY SIGNED ON:  12/24/2020, 11:47 AM [facility] PH: [phone]   FX: [phone] ACCREDITED BY THE AMERICAN ACADEMY OF SLEEP MEDICINE

## 2021-01-03 MED ORDER — AMBULATORY NON FORMULARY MEDICATION: 99 refills | Status: DC

## 2021-01-03 NOTE — Addendum Note (Signed)
Addended by: Deirdre Pippins on: 01/03/2021 01:42 PM   Modules accepted: Orders

## 2021-01-10 ENCOUNTER — Other Ambulatory Visit (HOSPITAL_COMMUNITY): Payer: Self-pay

## 2021-01-16 ENCOUNTER — Other Ambulatory Visit: Payer: Self-pay

## 2021-01-16 DIAGNOSIS — G4733 Obstructive sleep apnea (adult) (pediatric): Secondary | ICD-10-CM

## 2021-01-16 DIAGNOSIS — G4730 Sleep apnea, unspecified: Secondary | ICD-10-CM

## 2021-01-16 DIAGNOSIS — R0683 Snoring: Secondary | ICD-10-CM

## 2021-01-16 MED ORDER — AMBULATORY NON FORMULARY MEDICATION: 99 refills | Status: DC

## 2021-02-07 ENCOUNTER — Telehealth: Payer: Self-pay | Admitting: Internal Medicine

## 2021-02-07 DIAGNOSIS — G4733 Obstructive sleep apnea (adult) (pediatric): Secondary | ICD-10-CM

## 2021-02-07 NOTE — Telephone Encounter (Signed)
ATC x2 phone just rings and rings then rings busy.   Will leave in triage.

## 2021-02-09 NOTE — Telephone Encounter (Signed)
Called and spoke with the pt  He states that the BIPAP pressure may to be too high  He is waking up with HA and gas pain  States that he did not use machine for the past 2 nights and symptoms better  Checked Airview and Care orchestrator and pt not enrolled  Called and LM with [employer] for DL/to be added to Airview  Will wait for this and hold in triage until can get that DL and then send msg to [facility]

## 2021-02-13 NOTE — Telephone Encounter (Signed)
Checked airview and still no DL Sent community msg to [place], and [place] with [employer]

## 2021-02-14 NOTE — Telephone Encounter (Signed)
New, [name], CMA; Evalee Mutton Hello,   Patient received a [name], this machine is not in air view it is in Icode connect.  I attempted to pull the patient but was unable.  I messaged the download team to see about having the patient added and rights assigned for viewing.  I will let you know what I hear back.   Thanks,   Luellen Pucker   Will await to hear back from [place]

## 2021-02-15 NOTE — Telephone Encounter (Signed)
Brad from [employer] returning call can be reached @ [phone].Caren Griffins

## 2021-02-15 NOTE — Telephone Encounter (Signed)
Still not received and called Marc Wiggins with [employer]  She is going to get Brad to upload pt and will call me back once this has been done

## 2021-02-15 NOTE — Telephone Encounter (Signed)
Pt has a luna cpap and can not be put in [place], has to be put in I code connect, he has put in a request to his download team, it may take them a few days to get this done, but as soon as he has what you need he will let us know, says pt was set up on 8/25, agan his # is [phone].Caren Griffins

## 2021-02-15 NOTE — Telephone Encounter (Signed)
Called Brad back, no answer- LMTCB

## 2021-02-16 ENCOUNTER — Other Ambulatory Visit (HOSPITAL_COMMUNITY): Payer: Self-pay

## 2021-02-16 NOTE — Telephone Encounter (Signed)
Called and spoke with Nida Boatman at [employer] who states that he just recently got access to I Code Connects and is not very familiar with it and that Efraim Kaufmann would know. ATC Melissa, LMTCB. I can pull patient up in I code connects but it does not have any data. Will wait to hear back from [facility]

## 2021-02-16 NOTE — Telephone Encounter (Signed)
ATC patient to let him know about message from Dr. Maple Hudson, per [facility] left detailed message.  Order sent to [employer]. Also left message asking him to call back or send mychart message with I code # and serial #. Nothing further needed at this time.

## 2021-02-16 NOTE — Telephone Encounter (Signed)
Spoke with Melissa from [employer] who states that the patient was started on [employer] on 02/01/2021. She states that the patient would have to bring in SD card to get information off of it. They did register him in I code connects. Below is what his current settings are. Will route to Dr. Maple Hudson

## 2021-02-16 NOTE — Telephone Encounter (Signed)
Order- please ask [employer] to reduce his BIPAP pressure to 15/ 4. We will seek his I Code download after that with [employer]'s health.

## 2021-02-19 NOTE — Telephone Encounter (Signed)
LMTCB for the pt 

## 2021-02-20 ENCOUNTER — Encounter: Payer: Self-pay | Admitting: *Deleted

## 2021-02-20 NOTE — Telephone Encounter (Signed)
LMTCB and mailing letter per protocol.  °

## 2021-02-20 NOTE — Telephone Encounter (Signed)
Spoke with patient who states that his serial number is SN: [id number] But he is not sure what to look for when it comes to the I code #. ATC Melissa with [employer] had to leave voicemail also sent community message.

## 2021-03-21 ENCOUNTER — Ambulatory Visit: Payer: No Typology Code available for payment source | Admitting: Internal Medicine

## 2021-04-13 ENCOUNTER — Other Ambulatory Visit (HOSPITAL_COMMUNITY): Payer: Self-pay

## 2021-05-11 NOTE — Progress Notes (Signed)
12/15/20- 22 yoM never smoker for sleep evaluation courtesy of Dr [name] with concern of OSA and nocturnal hypoxemia. Medical problem list includes OSA, Insomnia, Anxiety Disorder, Depression HST 09/28/20- AHI 65.9/ hr, desaturation to 69% with 126 minutes </= 89%, body weight 232 lbs PAP titration 12/10/20- CPAP inadequate. Good control on BIPAP 22/18 with minO2 sat 96% Meds include Trazodone, Cymbalta Epworth score-3 Body weight today-245 lbs Covid vax--2 Phizer -----Gasping and limbs jerking at night, tired during the day Has lost weight without surgery, from peak 400 lbs. Snoring is some better. Wife has recorded him snoring and gasping. Both parents use CPAP. 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 Safe driving discussed.  No ENT surgery, heart, lung, thyroid or neurologic problem known.  05/14/21-45 year old male never smoker followed for OSA, complicated by Insomnia, Anxiety Disorder, Depression, Obesity BIPAP 15/4/ [employer] Download-requested Body weight today-252 lbs Covid vax-2 Phizer Flu vax- he thinks he had one BiPAP titration in July had indicated 22/18.  He had complained that this pressure gave him a headache and abdominal pain suggesting swallowed air.  We reduced pressure to 15/4 but he says with this his wife reports that he snores while supine and he is waking feeling unrested.  He is comfortable with his mask.  He emphasizes he is not currently sleeping better.  He has an adjustable bed.  ROS-see HPI   + = positive Constitutional:    weight loss, night sweats, fevers, chills, fatigue, lassitude. HEENT:    headaches, difficulty swallowing, tooth/dental problems, sore throat,       sneezing, itching, ear ache, +nasal congestion, post nasal drip, snoring CV:    chest pain, orthopnea, PND, swelling in lower extremities, anasarca,       dizziness, palpitations Resp:   shortness of breath with exertion or at rest.                productive cough,   non-productive cough, coughing up of  blood.              change in color of mucus.  wheezing.   Skin:    rash or lesions. GI:  +heartburn, indigestion, abdominal pain, nausea, vomiting, diarrhea,                 change in bowel habits, loss of appetite GU: dysuria, change in color of urine, no urgency or frequency.   flank pain. MS:   joint pain, stiffness, decreased range of motion, back pain. Neuro-     nothing unusual Psych:  change in mood or affect.  depression or anxiety.   memory loss.   OBJ- Physical Exam General- Alert, Oriented, Affect-appropriate, Distress- none acute, +Obese Skin- rash-none, lesions- none, excoriation- none Lymphadenopathy- none Head- atraumatic            Eyes- Gross vision intact, PERRLA, conjunctivae and secretions clear            Ears- Hearing, canals-normal            Nose- Clear, no-Septal dev, mucus, polyps, erosion, perforation             Throat- Mallampati IV , mucosa clear , drainage- none, tonsils- atrophic, + teeth Neck- flexible , trachea midline, no stridor , thyroid nl, carotid no bruit Chest - symmetrical excursion , unlabored           Heart/CV- RRR , no murmur , no gallop  , no rub, nl s1 s2                           -  JVD- none , edema- none, stasis changes- none, varices- none           Lung- clear to P&A, wheeze- none, cough- none , dullness-none, rub- none           Chest wall-  Abd-  Br/ Gen/ Rectal- Not done, not indicated Extrem- cyanosis- none, clubbing, none, atrophy- none, strength- nl Neuro- grossly intact to observation

## 2021-05-14 ENCOUNTER — Ambulatory Visit (INDEPENDENT_AMBULATORY_CARE_PROVIDER_SITE_OTHER): Payer: No Typology Code available for payment source | Admitting: Internal Medicine

## 2021-05-14 ENCOUNTER — Encounter: Payer: Self-pay | Admitting: Internal Medicine

## 2021-05-14 ENCOUNTER — Other Ambulatory Visit: Payer: Self-pay

## 2021-05-14 VITALS — BP 118/80 | HR 76 | Temp 98.6°F | Ht 72.0 in | Wt 252.4 lb

## 2021-05-14 DIAGNOSIS — G4730 Sleep apnea, unspecified: Secondary | ICD-10-CM

## 2021-05-14 DIAGNOSIS — G4733 Obstructive sleep apnea (adult) (pediatric): Secondary | ICD-10-CM

## 2021-05-14 NOTE — Assessment & Plan Note (Signed)
He is having more trouble getting comfortable with BiPAP than we had hoped.  We have discussed options. Plan-we will try to increase pressure a little for better control, hoping that if he elevates the head of his bed some and uses simethicone, we can control headache and abdominal pain.  Meanwhile we will refer to ENT for consideration of alternatives.  His weight is drifting back up but he may be still in range to consider Inspire.

## 2021-05-14 NOTE — Patient Instructions (Addendum)
Order-- DME [employer]- please change BIPAP pressure to 18/6 and please install AirView  Order- referral to [facility]- Drs Jenne Pane or Annalee Genta   dx OSA  It may help your stomach to use otc simethicone (Beano, Gas-X) to help with swallowed air.

## 2021-05-28 ENCOUNTER — Other Ambulatory Visit (HOSPITAL_COMMUNITY): Payer: Self-pay

## 2021-06-18 ENCOUNTER — Telehealth: Payer: Self-pay | Admitting: Internal Medicine

## 2021-06-18 NOTE — Telephone Encounter (Signed)
ENT referral was placed 05/14/21 by Dr. Maple Hudson for severe sleep apnea.  [facility] stated they have not received ENT referral.  Message routed to [facility]

## 2021-06-19 NOTE — Telephone Encounter (Signed)
I had confirmation on 05/15/21 that they received it. I have faxed it again.

## 2021-07-05 ENCOUNTER — Other Ambulatory Visit: Payer: Self-pay

## 2021-07-05 ENCOUNTER — Other Ambulatory Visit (HOSPITAL_BASED_OUTPATIENT_CLINIC_OR_DEPARTMENT_OTHER): Payer: Self-pay

## 2021-07-05 ENCOUNTER — Encounter (HOSPITAL_BASED_OUTPATIENT_CLINIC_OR_DEPARTMENT_OTHER): Payer: Self-pay | Admitting: Nurse Practitioner

## 2021-07-05 ENCOUNTER — Ambulatory Visit (INDEPENDENT_AMBULATORY_CARE_PROVIDER_SITE_OTHER): Payer: No Typology Code available for payment source | Admitting: Nurse Practitioner

## 2021-07-05 VITALS — BP 120/78 | HR 72 | Ht 72.0 in | Wt 253.0 lb

## 2021-07-05 DIAGNOSIS — M25511 Pain in right shoulder: Secondary | ICD-10-CM | POA: Diagnosis not present

## 2021-07-05 DIAGNOSIS — Z1321 Encounter for screening for nutritional disorder: Secondary | ICD-10-CM

## 2021-07-05 DIAGNOSIS — E669 Obesity, unspecified: Secondary | ICD-10-CM

## 2021-07-05 DIAGNOSIS — J3089 Other allergic rhinitis: Secondary | ICD-10-CM

## 2021-07-05 DIAGNOSIS — M25512 Pain in left shoulder: Secondary | ICD-10-CM

## 2021-07-05 DIAGNOSIS — G8929 Other chronic pain: Secondary | ICD-10-CM

## 2021-07-05 DIAGNOSIS — Z1329 Encounter for screening for other suspected endocrine disorder: Secondary | ICD-10-CM

## 2021-07-05 DIAGNOSIS — F3342 Major depressive disorder, recurrent, in full remission: Secondary | ICD-10-CM

## 2021-07-05 DIAGNOSIS — G4730 Sleep apnea, unspecified: Secondary | ICD-10-CM

## 2021-07-05 DIAGNOSIS — Z13228 Encounter for screening for other metabolic disorders: Secondary | ICD-10-CM

## 2021-07-05 DIAGNOSIS — E66811 Obesity, class 1: Secondary | ICD-10-CM | POA: Insufficient documentation

## 2021-07-05 DIAGNOSIS — F410 Panic disorder [episodic paroxysmal anxiety] without agoraphobia: Secondary | ICD-10-CM

## 2021-07-05 DIAGNOSIS — F411 Generalized anxiety disorder: Secondary | ICD-10-CM

## 2021-07-05 DIAGNOSIS — Z130 Encounter for screening for diseases of the blood and blood-forming organs and certain disorders involving the immune mechanism: Secondary | ICD-10-CM

## 2021-07-05 HISTORY — DX: Major depressive disorder, recurrent, in full remission: F33.42

## 2021-07-05 MED ORDER — ONDANSETRON HCL 8 MG PO TABS
8.0000 mg | ORAL_TABLET | Freq: Three times a day (TID) | ORAL | 2 refills | Status: DC | PRN
  Filled 2021-07-05: qty 30, 10d supply, fill #0
  Filled 2021-10-16 – 2021-10-18 (×2): qty 30, 10d supply, fill #1
  Filled 2021-11-29: qty 30, 10d supply, fill #2

## 2021-07-05 MED ORDER — MONTELUKAST SODIUM 10 MG PO TABS
10.0000 mg | ORAL_TABLET | Freq: Every day | ORAL | 3 refills | Status: DC
  Filled 2021-07-05: qty 90, 90d supply, fill #0
  Filled 2021-11-29: qty 90, 90d supply, fill #1

## 2021-07-05 MED ORDER — SEMAGLUTIDE (1 MG/DOSE) 4 MG/3ML ~~LOC~~ SOPN
1.0000 mg | PEN_INJECTOR | SUBCUTANEOUS | 0 refills | Status: DC
  Filled 2021-07-05: qty 3, 28d supply, fill #0

## 2021-07-05 MED ORDER — SEMAGLUTIDE(0.25 OR 0.5MG/DOS) 2 MG/1.5ML ~~LOC~~ SOPN
PEN_INJECTOR | SUBCUTANEOUS | 0 refills | Status: DC
  Filled 2021-07-05: qty 3, 70d supply, fill #0
  Filled 2021-07-13: qty 1.5, 42d supply, fill #0

## 2021-07-05 NOTE — Assessment & Plan Note (Signed)
Bilateral shoulder pain with left greater than right. At this time it is unclear if symptoms could be arthritic in nature given the length of time they have been present versus previous injury with delayed healing. At this time I do feel evaluation with orthopedics is warranted for imaging and further recommendations. Patient may benefit from physical therapy and conservative management however will defer this decision to orthopedics after further evaluation. He will let me know if he does not hear from them within the next week.

## 2021-07-05 NOTE — Progress Notes (Signed)
[name], DNP, AGNP-c Primary Care & Sports Medicine [address]   [address] [place], [place] [address] [phone]  New patient visit   Patient: Marc ArtistChristopher Palmateer   DOB: 1975/08/19   46 y.o. Male  MRN: [id number] Visit Date: 07/05/2021  Patient Care Team: Erma Raiche, Sung AmabileSara E, NP as PCP - General (Nurse Practitioner)  Today's healthcare provider: Tollie EthSara E. Esaw Knippel, NP   Chief Complaint  Patient presents with   Allergic Rhinitis    Establish Care   Subjective    HPI  Marc Wiggins is a 46 y.o. male who presents today as a new patient to establish care.    He has concerns today with chronic rhinitis/allergy symptoms, bilateral shoulder pain, and weight gain.  Chronic rhinitis/allergy He endorses daily allergy symptoms with nasal congestion and rhinorrhea.  He tells me he wakes every morning with significant sinus congestion and drainage.  The drainage is always clear however it is quite burdensome for him.  He has been using over-the-counter allergy medication such as Claritin, Allegra, or Zyrtec and has been alternating the medication every few months.  He reports he does not notice any significant difference in his symptoms when he changes the medication.  He has not tried any prescription medication for this in the past.  He does use a CPAP at night, but had symptoms prior to starting this.  He does have 1 pet in the home however it is a nondander/nonshedding dog.  His symptoms were present prior to him getting the dog. He denies symptoms of shortness of breath, cough, sore throat, headaches.  Bilateral shoulder pain Cristal DeerChristopher tells me approximately 2 years ago he injured his left shoulder while lifting weights.  He tells me that he rested the shoulder and it began to improve after a couple of weeks but has still had some residual weakness and pain.  Since that time he reports increased pain and weakness when lifting heavy objects.  He also endorses a  clicking sound and significant pain when raising the arm higher than approximately 110 degrees.  He states that over the last several months he has also began to experience similar pain in the right shoulder.  He has no known injury to the right shoulder but's symptoms are similar to the left. He does endorse weakness with lifting and pulling.  He is not experiencing any numbness or tingling in either hand.  He does endorse crepitus in both shoulders.  He still feels the left shoulder is worse than the right but he is concerned that this is progressing.  He has not had any imaging or evaluation for this in the past.  Weight gain He would like to discuss the option of weight loss medication today.  He reports a weight gain of approximately 25 pounds in the last few months.  He does tell me that his ability to work out has become less frequent due to demands from his job but he is concerned about the increased weight gain and the effects that this is having on his health.  He does have a history of sleep apnea for which he uses a BiPAP.  He also has a significant family history of type 2 diabetes.  His wife was recently started on semaglutide for weight management and has had success with this and he is interested in learning if this would be beneficial option for him.  He does watch his diet closely and avoids high carbohydrate meals.  He endorses a  history of anxiety and depression symptoms which he feels are currently well controlled with his current medication regimen.  He is currently taking Cymbalta 40 mg daily and trazodone 150 mg as needed at bedtime.   Past Medical History:  Diagnosis Date   Current moderate episode of major depressive disorder without prior episode (HCC) 06/29/2018   Generalized anxiety disorder with panic attacks    Recurrent major depressive disorder, in full remission (HCC) 07/05/2021   Severe sleep apnea    Past Surgical History:  Procedure Laterality Date    CHOLECYSTECTOMY     ENDOVENOUS ABLATION SAPHENOUS VEIN W/ LASER Right 04/29/2019   endovenous laser ablation right greater saphenous vein and stab phlebectomy 10-20 incisions right leg by Cari Caraway MD    Family Status  Relation Name Status   Father Kathlene November (Not Specified)   Mat Claudean Severance (Not Specified)   MGM Alona Bene (Not Specified)   PGM Alona Bene (Not Specified)   Family History  Problem Relation Age of Onset   Hypertension Father    Diabetes Maternal Aunt    Stroke Maternal Grandmother    Diabetes Maternal Grandmother    Heart attack Maternal Grandmother    Cancer Paternal Grandmother    Social History   Socioeconomic History   Marital status: Married    Spouse name: Not on file   Number of children: Not on file   Years of education: Not on file   Highest education level: Not on file  Occupational History   Not on file  Tobacco Use   Smoking status: Never   Smokeless tobacco: Never  Vaping Use   Vaping Use: Never used  Substance and Sexual Activity   Alcohol use: Never   Drug use: Never   Sexual activity: Yes  Other Topics Concern   Not on file  Social History Narrative   Not on file   Social Determinants of Health   Financial Resource Strain: Not on file  Food Insecurity: Not on file  Transportation Needs: Not on file  Physical Activity: Not on file  Stress: Not on file  Social Connections: Not on file   Outpatient Medications Prior to Visit  Medication Sig   AMBULATORY NON FORMULARY MEDICATION Supply ordered: BiPAP therapy on settings: 22/18 cm H2O. Please include other supplies needed (headgear, cushions, filters, heated tuubing and water chamber) Dx: obstructive sleep apnea   AMBULATORY NON FORMULARY MEDICATION Supply ordered: CPAP and other supplies needed (headgear, cushions, filters, heated tuubing and water chamber) Dx: obstructive sleep apnea Settings: auto-titration 5-20 cmH2O   DULoxetine (CYMBALTA) 20 MG capsule Take 2 capsules (40 mg total) by  mouth daily.   fexofenadine (ALLEGRA) 180 MG tablet Take 1 tablet (180 mg total) by mouth daily.   Multiple Vitamin (MULTIVITAMIN) tablet Take 1 tablet daily by mouth.   traZODone (DESYREL) 150 MG tablet Take 1 tablet (150 mg total) by mouth at bedtime as needed for sleep   No facility-administered medications prior to visit.   Allergies  Allergen Reactions   Hydrocodone     Immunization History  Administered Date(s) Administered   Influenza,inj,Quad PF,6+ Mos 08/27/2018, 03/26/2019   Influenza-Unspecified 03/10/2021   Tdap 11/17/2018    Health Maintenance  Topic Date Due   COVID-19 Vaccine (1) Never done   TETANUS/TDAP  11/16/2028   INFLUENZA VACCINE  Completed   HPV VACCINES  Aged Out   Hepatitis C Screening  Discontinued   HIV Screening  Discontinued    Patient Care Team: Tollie Eth, NP  as PCP - General (Nurse Practitioner)  Review of Systems All review of systems negative except what is listed in the HPI   Objective    Ht 6' (1.829 m)    Wt 253 lb (114.8 kg)    BMI 34.31 kg/m  Physical Exam Vitals and nursing note reviewed.  Constitutional:      Appearance: Normal appearance.  HENT:     Head: Normocephalic.  Eyes:     Extraocular Movements: Extraocular movements intact.     Conjunctiva/sclera: Conjunctivae normal.     Pupils: Pupils are equal, round, and reactive to light.  Neck:     Vascular: No carotid bruit.  Cardiovascular:     Rate and Rhythm: Normal rate and regular rhythm.     Pulses: Normal pulses.     Heart sounds: Normal heart sounds. No murmur heard. Pulmonary:     Effort: Pulmonary effort is normal.     Breath sounds: Normal breath sounds. No wheezing.  Abdominal:     General: Abdomen is flat. Bowel sounds are normal. There is no distension.     Palpations: Abdomen is soft.     Tenderness: There is no abdominal tenderness. There is no guarding.  Musculoskeletal:     Right shoulder: Tenderness present. No swelling, deformity or  crepitus. Decreased range of motion. Decreased strength. Normal pulse.     Left shoulder: Tenderness and crepitus present. No swelling or deformity. Decreased range of motion. Decreased strength. Normal pulse.     Cervical back: Normal range of motion.     Right lower leg: No edema.     Left lower leg: No edema.     Comments: Tenderness directed over the coracoacromial ligament area consistent with pain during overhead movement.   Lymphadenopathy:     Cervical: No cervical adenopathy.  Skin:    General: Skin is warm and dry.     Capillary Refill: Capillary refill takes less than 2 seconds.  Neurological:     General: No focal deficit present.     Mental Status: He is alert and oriented to person, place, and time.     Motor: No weakness.  Psychiatric:        Mood and Affect: Mood normal.        Behavior: Behavior normal.        Thought Content: Thought content normal.        Judgment: Judgment normal.    Depression Screen PHQ 2/9 Scores 07/05/2021 12/07/2020 03/23/2020 02/24/2020  PHQ - 2 Score 0 0 0 0  PHQ- 9 Score 3 - 3 1   No results found for any visits on 07/05/21.  Assessment & Plan      Problem List Items Addressed This Visit     Generalized anxiety disorder with panic attacks    Patient symptoms are currently stable on daily duloxetine use. Discussed with the patient recommendation for staying on medication particularly through the winter months.  He did express interest in the possibility of coming off of the medication to see if he is stable without it. We will gladly help the patient taper off of the medication if he wishes to do so with strict monitoring of mood symptoms for worsening anxiety or depression.  Suggest waiting till spring to consider this. He will let me know if he is interested in tapering.      Severe sleep apnea    OSA currently on nightly BiPAP use.  He is currently awaiting referral for ENT to evaluate  for alternative options. He is followed by Dr.  Maple Hudson and pulmonology. Will work with patient to obtain adequate weight control in order to help best manage sleep apnea symptoms.      Obesity (BMI 30.0-34.9) - Primary    BMI today 34.31. He is monitoring his diet closely however has been unable to work out as consistently recently due to increased demands from work. Discussed options with patient including medication trials which may be effective. Review of labs today does show history of elevation in blood sugar reading on last draws.  No A1c was done at that time.  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  I do feel it would be beneficial for this patient to help reduce BMI to control his sleep apnea. We will obtain labs today for evaluation of blood sugar and lipids. We will plan to start semaglutide and monitor closely. Recommend alterations to intake to include smaller meals, decrease saturated fat to no more than 13 g a day, decrease carbohydrates to no more than 150 g a day, increase water intake, avoid soda and not on nutrient drinks high in calories for best results. Patient will follow-up in 3 months.      Relevant Medications   Semaglutide,0.25 or 0.5MG /DOS, 2 MG/1.5ML SOPN   Semaglutide, 1 MG/DOSE, 4 MG/3ML SOPN   ondansetron (ZOFRAN) 8 MG tablet   Other Relevant Orders   Comprehensive metabolic panel   Lipid panel   Hemoglobin A1c   Non-seasonal allergic rhinitis    Symptoms and presentation consistent with rhinitis, suspected allergic in nature. Discussed options with patient to try prescription management.  At this time he is interested in this. Recommend trial of montelukast to see if this is helpful to reduce some of his symptoms.  Discussed the option of possible addition for azelastine which may be helpful as well. We will plan to follow-up in the next few months to see how this medication has worked for him. It may be helpful to  discuss with ENT when he is there to see if they have any additional suggestions.      Relevant Medications   montelukast (SINGULAIR) 10 MG tablet   Chronic pain of both shoulders    Bilateral shoulder pain with left greater than right. At this time it is unclear if symptoms could be arthritic in nature given the length of time they have been present versus previous injury with delayed healing. At this time I do feel evaluation with orthopedics is warranted for imaging and further recommendations. Patient may benefit from physical therapy and conservative management however will defer this decision to orthopedics after further evaluation. He will let me know if he does not hear from them within the next week.      Relevant Orders   AMB referral to orthopedics   Recurrent major depressive disorder, in full remission (HCC)    History of recurrent depression currently well controlled on 40 mg of Cymbalta daily. No concerning symptoms are present today. Recommend continuation of medication. Discussed with patient if he does wish to attempt to taper off the medication please let us know and we can help construct a safe taper but recommend avoiding during winter months as this could result in unnecessary exacerbation. Patient expressed understanding and will let me know if he wishes to taper.      Other Visit Diagnoses     Screening for endocrine, nutritional, metabolic and immunity  disorder       Relevant Orders   Comprehensive metabolic panel   Lipid panel   Hemoglobin A1c        Return in about 3 months (around 10/03/2021) for weight check.      Debroah Shuttleworth, Sung Amabile, NP, DNP, AGNP-C Primary Care & Sports Medicine at [facility]

## 2021-07-05 NOTE — Patient Instructions (Addendum)
Thank you for choosing [place] at [facility] for your Primary Care needs. I am excited for the opportunity to partner with you to meet your health care goals. It was a pleasure meeting you today!  Recommendations from today's visit: We will work to get the semaglutide approved for you to start. I will send it to the pharmacy here at [facility] so they can get this ready for you ASAP. Please let me know if there are any issues.  I have sent the referral to Dr. Sammuel Hines with orthopedics for him to evaluate your shoulders. You should hear from him in the next week. If not, please let me know.  I will be in touch with you when we get your labs back and let you know if there are any concerns.   Information on diet, exercise, and health maintenance recommendations are listed below. This is information to help you be sure you are on track for optimal health and monitoring.   Please look over this and let us know if you have any questions or if you have completed any of the health maintenance outside of [place] so that we can be sure your records are up to date.  ___________________________________________________________ About Me: I am an Adult-Geriatric Nurse Practitioner with a background in caring for patients for more than 20 years with a strong intensive care background. I provide primary care and sports medicine services to patients age 63 and older within this office. My education had a strong focus on caring for the older adult population, which I am passionate about. I am also the director of the APP Fellowship with [facility].   My desire is to provide you with the best service through preventive medicine and supportive care. I consider you a part of the medical team and value your input. I work diligently to ensure that you are heard and your needs are met in a safe and effective manner. I want you to feel comfortable with me as your provider and want you to know that  your health concerns are important to me.  For your information, our office hours are: Monday, Tuesday, and Thursday 8:00 AM - 5:00 PM Wednesday and Friday 8:00 AM - 12:00 PM.   In my time away from the office I am teaching new APP's within the system and am unavailable, but my partner, Dr. Burnard Bunting is in the office for emergent needs.   If you have questions or concerns, please call our office at [phone] or send [place] a MyChart message and we will respond as quickly as possible.  ____________________________________________________________ MyChart:  For all urgent or time sensitive needs we ask that you please call the office to avoid delays. Our number is [phone]. MyChart is not constantly monitored and due to the large volume of messages a day, replies may take up to 72 business hours.  MyChart Policy: MyChart allows for you to see your visit notes, after visit summary, provider recommendations, lab and tests results, make an appointment, request refills, and contact your provider or the office for non-urgent questions or concerns. Providers are seeing patients during normal business hours and do not have built in time to review MyChart messages.  We ask that you allow a minimum of 3 business days for responses to [employer]. For this reason, please do not send urgent requests through [place]. Please call the office at [phone]. New and ongoing conditions may require a visit. We have virtual and in person  visit available for your convenience.  Complex MyChart concerns may require a visit. Your provider may request you schedule a virtual or in person visit to ensure we are providing the best care possible. MyChart messages sent after 11:00 AM on Friday will not be received by the provider until Monday morning.    Lab and Test Results: You will receive your lab and test results on MyChart as soon as they are completed and results have been sent by the lab or testing facility.  Due to this service, you will receive your results BEFORE your provider.  I review lab and tests results each morning prior to seeing patients. Some results require collaboration with other providers to ensure you are receiving the most appropriate care. For this reason, we ask that you please allow a minimum of 3-5 business days from the time the ALL results have been received for your provider to receive and review lab and test results and contact you about these.  Most lab and test result comments from the provider will be sent through [place]. Your provider may recommend changes to the plan of care, follow-up visits, repeat testing, ask questions, or request an office visit to discuss these results. You may reply directly to this message or call the office at [phone] to provide information for the provider or set up an appointment. In some instances, you will be called with test results and recommendations. Please let us know if this is preferred and we will make note of this in your chart to provide this for you.    If you have not heard a response to your lab or test results in 5 business days from all results returning to [place], please call the office to let us know. We ask that you please avoid calling prior to this time unless there is an emergent concern. Due to high call volumes, this can delay the resulting process.  After Hours: For all non-emergency after hours needs, please call the office at [phone] and select the option to reach the on-call provider service. On-call services are shared between multiple [facility] offices and therefore it will not be possible to speak directly with your provider. On-call providers may provide medical advice and recommendations, but are unable to provide refills for maintenance medications.  For all emergency or urgent medical needs after normal business hours, we recommend that you seek care at the closest Urgent Care or Emergency Department to  ensure appropriate treatment in a timely manner.  [facility] has a 24 hour emergency room located on the ground floor for your convenience.   Urgent Concerns During the Business Day Providers are seeing patients from 8AM to [place] with a busy schedule and are most often not able to respond to non-urgent calls until the end of the day or the next business day. If you should have URGENT concerns during the day, please call and speak to the nurse or schedule a same day appointment so that we can address your concern without delay.   Thank you, again, for choosing me as your health care partner. I appreciate your trust and look forward to learning more about you.   Worthy Keeler, DNP, AGNP-c ___________________________________________________________  Health Maintenance Recommendations Screening Testing Mammogram Every 1 -2 years based on history and risk factors Starting at age 24 Pap Smear Ages 21-39 every 3 years Ages 58-65 every 5 years with HPV testing More frequent testing may be required based on results and history Colon  Cancer Screening Every 1-10 years based on test performed, risk factors, and history Starting at age 42 Bone Density Screening Every 2-10 years based on history Starting at age 31 for women Recommendations for men differ based on medication usage, history, and risk factors AAA Screening One time ultrasound Men 30-56 years old who have every smoked Lung Cancer Screening Low Dose Lung CT every 12 months Age 62-80 years with a 30 pack-year smoking history who still smoke or who have quit within the last 15 years  Screening Labs Routine  Labs: Complete Blood Count (CBC), Complete Metabolic Panel (CMP), Cholesterol (Lipid Panel) Every 6-12 months based on history and medications May be recommended more frequently based on current conditions or previous results Hemoglobin A1c Lab Every 3-12 months based on history and previous  results Starting at age 5 or earlier with diagnosis of diabetes, high cholesterol, BMI >26, and/or risk factors Frequent monitoring for patients with diabetes to ensure blood sugar control Thyroid Panel (TSH w/ T3 & T4) Every 6 months based on history, symptoms, and risk factors May be repeated more often if on medication HIV One time testing for all patients 64 and older May be repeated more frequently for patients with increased risk factors or exposure Hepatitis C One time testing for all patients 32 and older May be repeated more frequently for patients with increased risk factors or exposure Gonorrhea, Chlamydia Every 12 months for all sexually active persons 13-24 years Additional monitoring may be recommended for those who are considered high risk or who have symptoms PSA Men 63-102 years old with risk factors Additional screening may be recommended from age 67-69 based on risk factors, symptoms, and history  Vaccine Recommendations Tetanus Booster All adults every 10 years Flu Vaccine All patients 6 months and older every year COVID Vaccine All patients 12 years and older Initial dosing with booster May recommend additional booster based on age and health history HPV Vaccine 2 doses all patients age 43-26 Dosing may be considered for patients over 26 Shingles Vaccine (Shingrix) 2 doses all adults 75 years and older Pneumonia (Pneumovax 23) All adults 43 years and older May recommend earlier dosing based on health history Pneumonia (Prevnar 48) All adults 98 years and older Dosed 1 year after Pneumovax 23  Additional Screening, Testing, and Vaccinations may be recommended on an individualized basis based on family history, health history, risk factors, and/or exposure.  __________________________________________________________  Diet Recommendations for All Patients  I recommend that all patients maintain a diet low in saturated fats, carbohydrates, and cholesterol.  While this can be challenging at first, it is not impossible and small changes can make big differences.  Things to try: Decreasing the amount of soda, sweet tea, and/or juice to one or less per day and replace with water While water is always the first choice, if you do not like water you may consider adding a water additive without sugar to improve the taste other sugar free drinks Replace potatoes with a brightly colored vegetable at dinner Use healthy oils, such as canola oil or olive oil, instead of butter or hard margarine Limit your bread intake to two pieces or less a day Replace regular pasta with low carb pasta options Bake, broil, or grill foods instead of frying Monitor portion sizes  Eat smaller, more frequent meals throughout the day instead of large meals  An important thing to remember is, if you love foods that are not great for your health, you don't have to give  them up completely. Instead, allow these foods to be a reward when you have done well. Allowing yourself to still have special treats every once in a while is a nice way to tell yourself thank you for working hard to keep yourself healthy.   Also remember that every day is a new day. If you have a bad day and "fall off the wagon", you can still climb right back up and keep moving along on your journey!  We have resources available to help you!  Some websites that may be helpful include: www.http://carter.biz/  Www.VeryWellFit.com _____________________________________________________________  Activity Recommendations for All Patients  I recommend that all adults get at least 20 minutes of moderate physical activity that elevates your heart rate at least 5 days out of the week.  Some examples include: Walking or jogging at a pace that allows you to carry on a conversation Cycling (stationary bike or outdoors) Water aerobics Yoga Weight lifting Dancing If physical limitations prevent you from putting stress on your  joints, exercise in a pool or seated in a chair are excellent options.  Do determine your MAXIMUM heart rate for activity: YOUR AGE - 220 = MAX HeartRate   Remember! Do not push yourself too hard.  Start slowly and build up your pace, speed, weight, time in exercise, etc.  Allow your body to rest between exercise and get good sleep. You will need more water than normal when you are exerting yourself. Do not wait until you are thirsty to drink. Drink with a purpose of getting in at least 8, 8 ounce glasses of water a day plus more depending on how much you exercise and sweat.    If you begin to develop dizziness, chest pain, abdominal pain, jaw pain, shortness of breath, headache, vision changes, lightheadedness, or other concerning symptoms, stop the activity and allow your body to rest. If your symptoms are severe, seek emergency evaluation immediately. If your symptoms are concerning, but not severe, please let us know so that we can recommend further evaluation.

## 2021-07-05 NOTE — Assessment & Plan Note (Signed)
BMI today 34.31. He is monitoring his diet closely however has been unable to work out as consistently recently due to increased demands from work. Discussed options with patient including medication trials which may be effective. Review of labs today does show history of elevation in blood sugar reading on last draws.  No A1c was done at that time.  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  I do feel it would be beneficial for this patient to help reduce BMI to control his sleep apnea. We will obtain labs today for evaluation of blood sugar and lipids. We will plan to start semaglutide and monitor closely. Recommend alterations to intake to include smaller meals, decrease saturated fat to no more than 13 g a day, decrease carbohydrates to no more than 150 g a day, increase water intake, avoid soda and not on nutrient drinks high in calories for best results. Patient will follow-up in 3 months.

## 2021-07-05 NOTE — Assessment & Plan Note (Signed)
Symptoms and presentation consistent with rhinitis, suspected allergic in nature. Discussed options with patient to try prescription management.  At this time he is interested in this. Recommend trial of montelukast to see if this is helpful to reduce some of his symptoms.  Discussed the option of possible addition for azelastine which may be helpful as well. We will plan to follow-up in the next few months to see how this medication has worked for him. It may be helpful to discuss with ENT when he is there to see if they have any additional suggestions.

## 2021-07-05 NOTE — Assessment & Plan Note (Signed)
Patient symptoms are currently stable on daily duloxetine use. Discussed with the patient recommendation for staying on medication particularly through the winter months.  He did express interest in the possibility of coming off of the medication to see if he is stable without it. We will gladly help the patient taper off of the medication if he wishes to do so with strict monitoring of mood symptoms for worsening anxiety or depression.  Suggest waiting till spring to consider this. He will let me know if he is interested in tapering.

## 2021-07-05 NOTE — Assessment & Plan Note (Signed)
OSA currently on nightly BiPAP use.  He is currently awaiting referral for ENT to evaluate for alternative options. He is followed by Dr. Maple Hudson and pulmonology. Will work with patient to obtain adequate weight control in order to help best manage sleep apnea symptoms.

## 2021-07-05 NOTE — Assessment & Plan Note (Signed)
History of recurrent depression currently well controlled on 40 mg of Cymbalta daily. No concerning symptoms are present today. Recommend continuation of medication. Discussed with patient if he does wish to attempt to taper off the medication please let us know and we can help construct a safe taper but recommend avoiding during winter months as this could result in unnecessary exacerbation. Patient expressed understanding and will let me know if he wishes to taper.

## 2021-07-06 ENCOUNTER — Other Ambulatory Visit (HOSPITAL_BASED_OUTPATIENT_CLINIC_OR_DEPARTMENT_OTHER): Payer: Self-pay

## 2021-07-06 LAB — COMPREHENSIVE METABOLIC PANEL
ALT: 40 IU/L (ref 0–44)
AST: 35 IU/L (ref 0–40)
Albumin/Globulin Ratio: 1.8 (ref 1.2–2.2)
Albumin: 4.6 g/dL (ref 4.0–5.0)
Alkaline Phosphatase: 101 IU/L (ref 44–121)
BUN/Creatinine Ratio: 16 (ref 9–20)
BUN: 19 mg/dL (ref 6–24)
Bilirubin Total: 0.4 mg/dL (ref 0.0–1.2)
CO2: 27 mmol/L (ref 20–29)
Calcium: 9.6 mg/dL (ref 8.7–10.2)
Chloride: 102 mmol/L (ref 96–106)
Creatinine, Ser: 1.21 mg/dL (ref 0.76–1.27)
Globulin, Total: 2.5 g/dL (ref 1.5–4.5)
Glucose: 80 mg/dL (ref 70–99)
Potassium: 4.6 mmol/L (ref 3.5–5.2)
Sodium: 142 mmol/L (ref 134–144)
Total Protein: 7.1 g/dL (ref 6.0–8.5)
eGFR: 76 mL/min/{1.73_m2} (ref >59)

## 2021-07-06 LAB — HEMOGLOBIN A1C
Est. average glucose Bld gHb Est-mCnc: 105 mg/dL
Hgb A1c MFr Bld: 5.3 % (ref 4.8–5.6)

## 2021-07-06 LAB — LIPID PANEL
Chol/HDL Ratio: 2.6 ratio (ref 0.0–5.0)
Cholesterol, Total: 215 mg/dL — ABNORMAL HIGH (ref 100–199)
HDL: 82 mg/dL (ref >39)
LDL Chol Calc (NIH): 120 mg/dL — ABNORMAL HIGH (ref 0–99)
Triglycerides: 73 mg/dL (ref 0–149)
VLDL Cholesterol Cal: 13 mg/dL (ref 5–40)

## 2021-07-09 ENCOUNTER — Other Ambulatory Visit (HOSPITAL_BASED_OUTPATIENT_CLINIC_OR_DEPARTMENT_OTHER): Payer: Self-pay

## 2021-07-11 ENCOUNTER — Other Ambulatory Visit (HOSPITAL_BASED_OUTPATIENT_CLINIC_OR_DEPARTMENT_OTHER): Payer: Self-pay

## 2021-07-13 ENCOUNTER — Other Ambulatory Visit (HOSPITAL_BASED_OUTPATIENT_CLINIC_OR_DEPARTMENT_OTHER): Payer: Self-pay

## 2021-07-16 ENCOUNTER — Other Ambulatory Visit (HOSPITAL_BASED_OUTPATIENT_CLINIC_OR_DEPARTMENT_OTHER): Payer: Self-pay

## 2021-07-16 ENCOUNTER — Other Ambulatory Visit (HOSPITAL_COMMUNITY): Payer: Self-pay

## 2021-07-17 ENCOUNTER — Other Ambulatory Visit (HOSPITAL_BASED_OUTPATIENT_CLINIC_OR_DEPARTMENT_OTHER): Payer: Self-pay

## 2021-07-18 ENCOUNTER — Other Ambulatory Visit (HOSPITAL_BASED_OUTPATIENT_CLINIC_OR_DEPARTMENT_OTHER): Payer: Self-pay

## 2021-07-19 ENCOUNTER — Ambulatory Visit (INDEPENDENT_AMBULATORY_CARE_PROVIDER_SITE_OTHER): Payer: No Typology Code available for payment source | Admitting: Orthopaedic Surgery

## 2021-07-19 ENCOUNTER — Other Ambulatory Visit (HOSPITAL_BASED_OUTPATIENT_CLINIC_OR_DEPARTMENT_OTHER): Payer: Self-pay | Admitting: Orthopaedic Surgery

## 2021-07-19 ENCOUNTER — Other Ambulatory Visit (HOSPITAL_BASED_OUTPATIENT_CLINIC_OR_DEPARTMENT_OTHER): Payer: Self-pay

## 2021-07-19 ENCOUNTER — Other Ambulatory Visit: Payer: Self-pay

## 2021-07-19 ENCOUNTER — Ambulatory Visit (HOSPITAL_BASED_OUTPATIENT_CLINIC_OR_DEPARTMENT_OTHER)
Admission: RE | Admit: 2021-07-19 | Discharge: 2021-07-19 | Disposition: A | Discharge status: Home / Self Care | Payer: No Typology Code available for payment source | Source: Ambulatory Visit | Attending: Orthopaedic Surgery | Admitting: Orthopaedic Surgery

## 2021-07-19 DIAGNOSIS — M25512 Pain in left shoulder: Secondary | ICD-10-CM | POA: Insufficient documentation

## 2021-07-19 DIAGNOSIS — M7522 Bicipital tendinitis, left shoulder: Secondary | ICD-10-CM

## 2021-07-19 DIAGNOSIS — M25511 Pain in right shoulder: Secondary | ICD-10-CM

## 2021-07-19 DIAGNOSIS — M7521 Bicipital tendinitis, right shoulder: Secondary | ICD-10-CM | POA: Diagnosis not present

## 2021-07-19 MED ORDER — LIDOCAINE HCL 1 % IJ SOLN
4.0000 mL | INTRAMUSCULAR | Status: AC | PRN
  Administered 2021-07-19: 4 mL

## 2021-07-19 MED ORDER — TRIAMCINOLONE ACETONIDE 40 MG/ML IJ SUSP
80.0000 mg | INTRAMUSCULAR | Status: AC | PRN
  Administered 2021-07-19: 80 mg via INTRA_ARTICULAR

## 2021-07-19 NOTE — Progress Notes (Signed)
Chief Complaint: Bilateral shoulder pain     History of Present Illness:    Marc Wiggins is a 46 y.o. male presents with bilateral shoulder pain.  This has been in the anterior aspect of the shoulder.  He states that the left shoulder has been going on for 2 to 2-1/2 years.  At that time he was lifting a weight for [employer] and subsequently felt pain in the front of the shoulder.  He states that this is on and off and is particularly occurs with overhead lifting.  He states that he also has had right shoulder pain which she believes that is compensation for the left side.  He states that this is a dull achy type pain that occurs in the front.  Very similar to the contralateral side.  He currently works as an Environmental education officer.  He does tend to do some overhead lifting as part of this although this is minimal as he is a Production designer, theatre/television/film.    Surgical History:   None  PMH/PSH/Family History/Social History/Meds/Allergies:    Past Medical History:  Diagnosis Date   Current moderate episode of major depressive disorder without prior episode (HCC) 06/29/2018   Generalized anxiety disorder with panic attacks    Recurrent major depressive disorder, in full remission (HCC) 07/05/2021   Severe sleep apnea    Past Surgical History:  Procedure Laterality Date   CHOLECYSTECTOMY     ENDOVENOUS ABLATION SAPHENOUS VEIN W/ LASER Right 04/29/2019   endovenous laser ablation right greater saphenous vein and stab phlebectomy 10-20 incisions right leg by Cari Caraway MD    Social History   Socioeconomic History   Marital status: Married    Spouse name: Not on file   Number of children: Not on file   Years of education: Not on file   Highest education level: Not on file  Occupational History   Not on file  Tobacco Use   Smoking status: Never   Smokeless tobacco: Never  Vaping Use   Vaping Use: Never used  Substance and Sexual  Activity   Alcohol use: Never   Drug use: Never   Sexual activity: Yes  Other Topics Concern   Not on file  Social History Narrative   Not on file   Social Determinants of Health   Financial Resource Strain: Not on file  Food Insecurity: Not on file  Transportation Needs: Not on file  Physical Activity: Not on file  Stress: Not on file  Social Connections: Not on file   Family History  Problem Relation Age of Onset   Hypertension Father    Diabetes Maternal Aunt    Stroke Maternal Grandmother    Diabetes Maternal Grandmother    Heart attack Maternal Grandmother    Cancer Paternal Grandmother    Allergies  Allergen Reactions   Hydrocodone    Current Outpatient Medications  Medication Sig Dispense Refill   AMBULATORY NON FORMULARY MEDICATION Supply ordered: BiPAP therapy on settings: 22/18 cm H2O. Please include other supplies needed (headgear, cushions, filters, heated tuubing and water chamber) Dx: obstructive sleep apnea 1 Units 99   AMBULATORY NON FORMULARY MEDICATION Supply ordered: CPAP and other supplies needed (headgear, cushions, filters, heated tuubing and water chamber) Dx: obstructive sleep apnea Settings: auto-titration 5-20 cmH2O 1 Units 99   DULoxetine (CYMBALTA)  20 MG capsule Take 2 capsules (40 mg total) by mouth daily. 180 capsule 3   fexofenadine (ALLEGRA) 180 MG tablet Take 1 tablet (180 mg total) by mouth daily. 90 tablet 3   montelukast (SINGULAIR) 10 MG tablet Take 1 tablet (10 mg total) by mouth at bedtime. 90 tablet 3   Multiple Vitamin (MULTIVITAMIN) tablet Take 1 tablet daily by mouth.     ondansetron (ZOFRAN) 8 MG tablet Take 1 tablet (8 mg total) by mouth every 8 (eight) hours as needed for nausea or vomiting. 30 tablet 2   Semaglutide, 1 MG/DOSE, 4 MG/3ML SOPN Inject 1 mg as directed once a week. Start after completing 0.5mg  dose 3 mL 0   Semaglutide,0.25 or 0.5MG /DOS, 2 MG/1.5ML SOPN Inject 0.25 mg into the skin once a week  for 28 days, THEN 0.5 mg once a week. Start with this dose then increase to 1mg . 3 mL 0   traZODone (DESYREL) 150 MG tablet Take 1 tablet (150 mg total) by mouth at bedtime as needed for sleep 90 tablet 3   No current facility-administered medications for this visit.   No results found.  Review of Systems:   A ROS was performed including pertinent positives and negatives as documented in the HPI.  Physical Exam :   Constitutional: NAD and appears stated age Neurological: Alert and oriented Psych: Appropriate affect and cooperative There were no vitals taken for this visit.   Comprehensive Musculoskeletal Exam:    Musculoskeletal Exam    Inspection Right Left  Skin No atrophy or winging No atrophy or winging  Palpation    Tenderness Biceps groove Biceps groove  Range of Motion    Flexion (passive) 170 170  Flexion (active) 170 170  Abduction 170 170  ER at the side 70 70  Can reach behind back to T12 T12  Strength     Full Full  Special Tests    Pseudoparalytic No No  Neurologic    Fires PIN, radial, median, ulnar, musculocutaneous, axillary, suprascapular, long thoracic, and spinal accessory innervated muscles. No abnormal sensibility  Vascular/Lymphatic    Radial Pulse 2+ 2+  Cervical Exam    Patient has symmetric cervical range of motion with negative Spurling's test.  Special Test: Positive speeds bilaterally     Imaging:   Xray (3 views right shoulder, 3 views left shoulder): Normal   I personally reviewed and interpreted the radiographs.   Assessment:   46 year old male with bilateral shoulder pain consistent with biceps tendinitis.  Given the fact that the left is been going on for longer than the right I do believe that he would benefit significantly from ultrasound-guided injections although it is more likely that we can completely resolve the right side compared to the left side.  We will plan to start with bilateral ultrasound-guided bicipital groove  injections and I will see him back in 1 month for follow-up.  Plan :    -Ultrasound-guided bilateral bicipital groove injections performed after verbal consent obtained    Procedure Note  Patient: Marc Wiggins             Date of Birth: 1975-09-27           MRN: [id number]             Visit Date: 07/19/2021  Procedures: Visit Diagnoses: No diagnosis found.  Large Joint Inj on 07/19/2021 3:56 PM Indications: pain Details: 22 G 1.5 in needle, ultrasound-guided anterior approach  Arthrogram: No  Medications: 4 mL lidocaine  1 %; 80 mg triamcinolone acetonide 40 MG/ML Outcome: tolerated well, no immediate complications  Right bicipital groove Procedure, treatment alternatives, risks and benefits explained, specific risks discussed. Consent was given by the patient. Immediately prior to procedure a time out was called to verify the correct patient, procedure, equipment, support staff and site/side marked as required. Patient was prepped and draped in the usual sterile fashion.    Large Joint Inj on 07/19/2021 3:57 PM Indications: pain Details: 22 G 1.5 in needle, ultrasound-guided anterior approach  Arthrogram: No  Medications: 4 mL lidocaine 1 %; 80 mg triamcinolone acetonide 40 MG/ML Outcome: tolerated well, no immediate complications  Left bicipital groove Procedure, treatment alternatives, risks and benefits explained, specific risks discussed. Consent was given by the patient. Immediately prior to procedure a time out was called to verify the correct patient, procedure, equipment, support staff and site/side marked as required. Patient was prepped and draped in the usual sterile fashion.          I personally saw and evaluated the patient, and participated in the management and treatment plan.  Huel Cote, MD Attending Physician, Orthopedic Surgery  This document was dictated using Dragon voice recognition software. A reasonable attempt at proof reading has  been made to minimize errors.

## 2021-07-20 ENCOUNTER — Encounter (HOSPITAL_BASED_OUTPATIENT_CLINIC_OR_DEPARTMENT_OTHER): Payer: Self-pay | Admitting: Pharmacist

## 2021-07-20 ENCOUNTER — Other Ambulatory Visit (HOSPITAL_BASED_OUTPATIENT_CLINIC_OR_DEPARTMENT_OTHER): Payer: Self-pay

## 2021-08-13 ENCOUNTER — Ambulatory Visit: Payer: No Typology Code available for payment source | Admitting: Internal Medicine

## 2021-08-16 ENCOUNTER — Ambulatory Visit (HOSPITAL_BASED_OUTPATIENT_CLINIC_OR_DEPARTMENT_OTHER): Payer: No Typology Code available for payment source | Admitting: Orthopaedic Surgery

## 2021-08-24 ENCOUNTER — Other Ambulatory Visit (HOSPITAL_COMMUNITY): Payer: Self-pay

## 2021-08-24 NOTE — Progress Notes (Signed)
HPI ?male never smoker followed for OSA, complicated by Insomnia, Anxiety Disorder, Depression, Obesity ?HST 09/28/20- AHI 65.9/ hr, desaturation to 69% with 126 minutes </= 89%, body weight 232 lbs ?PAP titration 12/10/20- CPAP inadequate. Good control on BIPAP 22/18 with minO2 sat 96% ? ?================================================================ ? ? ?05/14/21-46 year old male never smoker followed for OSA, complicated by Insomnia, Anxiety Disorder, Depression, Obesity ?BIPAP 15/4/ [employer] ?Download-requested ?Body weight today-252 lbs ?Covid vax-2 Phizer ?Flu vax- he thinks he had one ?BiPAP titration in July had indicated 22/18.  He had complained that this pressure gave him a headache and abdominal pain suggesting swallowed air.  We reduced pressure to 15/4 but he says with this his wife reports that he snores while supine and he is waking feeling unrested.  He is comfortable with his mask.  He emphasizes he is not currently sleeping better.  He has an adjustable bed. ? ?08/27/21- 46 year old male never smoker followed for OSA, complicated by Insomnia, Anxiety Disorder, Depression, Obesity, Rhinitiss,  ?BIPAP 18/6/ [employer] pressure ordered 05/14/21 ?Download-not available today ?Body weight today-244 lbs ?Covid vax-2 Moderna ?Flu vax-had ?[facility] Pol[facility] Kennedy) 08/07/21> weight loss, work on BiPAP comfort. If BMI gets < 32, then he could qualify for Inspire. ?BiPAP humidifier runs dry with tubing gurgles.  We discussed adjustment. ?He comments that [employer] sent an unfamiliar mask.  We discussed referral for mask fitting. ? ?ROS-see HPI   + = positive ?Constitutional:    weight loss, night sweats, fevers, chills, fatigue, lassitude. ?HEENT:    headaches, difficulty swallowing, tooth/dental problems, sore throat,  ?     sneezing, itching, ear ache, +nasal congestion, post nasal drip, snoring ?CV:    chest pain, orthopnea, PND, swelling in lower extremities, anasarca,       dizziness, palpitations ?Resp:   shortness of  breath with exertion or at rest.   ?             productive cough,   non-productive cough, coughing up of blood.   ?           change in color of mucus.  wheezing.   ?Skin:    rash or lesions. ?GI:  +heartburn, indigestion, abdominal pain, nausea, vomiting, diarrhea,  ?               change in bowel habits, loss of appetite ?GU: dysuria, change in color of urine, no urgency or frequency.   flank pain. ?MS:   joint pain, stiffness, decreased range of motion, back pain. ?Neuro-     nothing unusual ?Psych:  change in mood or affect.  depression or anxiety.   memory loss. ? ? ?OBJ- Physical Exam ?General- Alert, Oriented, Affect-appropriate, Distress- none acute, +Obese ?Skin- rash-none, lesions- none, excoriation- none ?Lymphadenopathy- none ?Head- atraumatic ?           Eyes- Gross vision intact, PERRLA, conjunctivae and secretions clear ?           Ears- Hearing, canals-normal ?           Nose- Clear, no-Septal dev, mucus, polyps, erosion, perforation  ?           Throat- Mallampati IV , mucosa clear , drainage- none, tonsils- atrophic, + teeth ?Neck- flexible , trachea midline, no stridor , thyroid nl, carotid no bruit ?Chest - symmetrical excursion , unlabored ?          Heart/CV- RRR , no murmur , no gallop  , no rub, nl s1 s2 ?                          -  JVD- none , edema- none, stasis changes- none, varices- none ?          Lung- clear to P&A, wheeze- none, cough- none , dullness-none, rub- none ?          Chest wall-  ?Abd-  ?Br/ Gen/ Rectal- Not done, not indicated ?Extrem- cyanosis- none, clubbing, none, atrophy- none, strength- nl ?Neuro- grossly intact to observation ? ? ?

## 2021-08-27 ENCOUNTER — Other Ambulatory Visit: Payer: Self-pay

## 2021-08-27 ENCOUNTER — Encounter: Payer: Self-pay | Admitting: Internal Medicine

## 2021-08-27 ENCOUNTER — Ambulatory Visit (INDEPENDENT_AMBULATORY_CARE_PROVIDER_SITE_OTHER): Payer: No Typology Code available for payment source | Admitting: Internal Medicine

## 2021-08-27 VITALS — BP 110/70 | HR 79 | Temp 98.2°F | Ht 72.0 in | Wt 244.0 lb

## 2021-08-27 DIAGNOSIS — G4730 Sleep apnea, unspecified: Secondary | ICD-10-CM | POA: Diagnosis not present

## 2021-08-27 DIAGNOSIS — E669 Obesity, unspecified: Secondary | ICD-10-CM | POA: Diagnosis not present

## 2021-08-27 NOTE — Patient Instructions (Signed)
We are contacting [employer] to get a download from your BIPAP machine. For now we will continue 18/6, mask of choice, humidifier, supplies, Air/view card ? ?Order- referral to sleep center for mask fitting ? ?Please call if we can help ?

## 2021-09-10 ENCOUNTER — Other Ambulatory Visit (HOSPITAL_COMMUNITY): Payer: Self-pay

## 2021-09-10 ENCOUNTER — Other Ambulatory Visit (HOSPITAL_BASED_OUTPATIENT_CLINIC_OR_DEPARTMENT_OTHER): Payer: Self-pay | Admitting: Nurse Practitioner

## 2021-09-10 DIAGNOSIS — G4730 Sleep apnea, unspecified: Secondary | ICD-10-CM

## 2021-09-10 DIAGNOSIS — E669 Obesity, unspecified: Secondary | ICD-10-CM

## 2021-09-10 MED ORDER — SEMAGLUTIDE-WEIGHT MANAGEMENT 1 MG/0.5ML ~~LOC~~ SOAJ
1.0000 mg | SUBCUTANEOUS | 2 refills | Status: AC
  Filled 2021-09-10: qty 2, fill #0

## 2021-09-10 MED ORDER — SEMAGLUTIDE-WEIGHT MANAGEMENT 0.5 MG/0.5ML ~~LOC~~ SOAJ
0.5000 mg | SUBCUTANEOUS | 0 refills | Status: DC
  Filled 2021-09-10 – 2021-11-07 (×2): qty 2, 28d supply, fill #0

## 2021-09-10 MED ORDER — SEMAGLUTIDE-WEIGHT MANAGEMENT 0.25 MG/0.5ML ~~LOC~~ SOAJ
0.2500 mg | SUBCUTANEOUS | 0 refills | Status: AC
  Filled 2021-09-10: qty 2, 28d supply, fill #0

## 2021-09-15 ENCOUNTER — Other Ambulatory Visit (HOSPITAL_COMMUNITY): Payer: Self-pay

## 2021-09-19 NOTE — Assessment & Plan Note (Signed)
He benefits from BiPAP and will continue BiPAP 18/6. ?Plan-refer for mask fitting.  Educated again on comfort and compliance goals. ?

## 2021-09-19 NOTE — Assessment & Plan Note (Signed)
Continue efforts at weight loss with diet and exercise lifestyle modification. ?

## 2021-09-24 ENCOUNTER — Other Ambulatory Visit (HOSPITAL_COMMUNITY): Payer: Self-pay

## 2021-09-26 ENCOUNTER — Other Ambulatory Visit (HOSPITAL_COMMUNITY): Payer: Self-pay

## 2021-10-03 ENCOUNTER — Ambulatory Visit (HOSPITAL_BASED_OUTPATIENT_CLINIC_OR_DEPARTMENT_OTHER): Payer: No Typology Code available for payment source | Admitting: Nurse Practitioner

## 2021-10-04 ENCOUNTER — Ambulatory Visit (HOSPITAL_BASED_OUTPATIENT_CLINIC_OR_DEPARTMENT_OTHER): Payer: No Typology Code available for payment source | Admitting: Nurse Practitioner

## 2021-10-11 ENCOUNTER — Encounter (HOSPITAL_BASED_OUTPATIENT_CLINIC_OR_DEPARTMENT_OTHER): Payer: Self-pay

## 2021-10-11 ENCOUNTER — Ambulatory Visit (INDEPENDENT_AMBULATORY_CARE_PROVIDER_SITE_OTHER): Payer: No Typology Code available for payment source | Admitting: Nurse Practitioner

## 2021-10-16 ENCOUNTER — Encounter (HOSPITAL_BASED_OUTPATIENT_CLINIC_OR_DEPARTMENT_OTHER): Payer: Self-pay | Admitting: Nurse Practitioner

## 2021-10-17 ENCOUNTER — Other Ambulatory Visit (HOSPITAL_BASED_OUTPATIENT_CLINIC_OR_DEPARTMENT_OTHER): Payer: Self-pay

## 2021-10-17 ENCOUNTER — Other Ambulatory Visit (HOSPITAL_COMMUNITY): Payer: Self-pay

## 2021-10-18 ENCOUNTER — Other Ambulatory Visit (HOSPITAL_BASED_OUTPATIENT_CLINIC_OR_DEPARTMENT_OTHER): Payer: Self-pay

## 2021-10-18 ENCOUNTER — Other Ambulatory Visit (HOSPITAL_COMMUNITY): Payer: Self-pay

## 2021-10-19 ENCOUNTER — Other Ambulatory Visit (HOSPITAL_COMMUNITY): Payer: Self-pay

## 2021-10-19 NOTE — Progress Notes (Signed)
Error- please disregard

## 2021-10-30 ENCOUNTER — Ambulatory Visit (INDEPENDENT_AMBULATORY_CARE_PROVIDER_SITE_OTHER): Payer: No Typology Code available for payment source | Admitting: Nurse Practitioner

## 2021-10-30 DIAGNOSIS — E669 Obesity, unspecified: Secondary | ICD-10-CM

## 2021-10-30 DIAGNOSIS — F411 Generalized anxiety disorder: Secondary | ICD-10-CM | POA: Diagnosis not present

## 2021-10-30 DIAGNOSIS — F410 Panic disorder [episodic paroxysmal anxiety] without agoraphobia: Secondary | ICD-10-CM

## 2021-10-30 NOTE — Progress Notes (Unsigned)
Virtual Visit Encounter telephone visit.   I connected with  Marc Wiggins on 10/31/21 at  3:10 PM EDT by secure audio telemedicine application. I verified that I am speaking with the correct person using two identifiers.   I introduced myself as a Publishing rights manager with the practice. The limitations of evaluation and management by telemedicine discussed with the patient and the availability of in person appointments. The patient expressed verbal understanding and consent to proceed.  Participating parties in this visit include: Myself and patient  The patient is: Patient Location: Home I am: Provider Location: Office/Clinic Subjective:    CC and HPI: Marc Wiggins is a 46 y.o. year old male presenting for follow up of weight management. Patient reports the following: He tells me he is doing very well with the [facility] He can tell he is losing weight because his clothes are fitting looser and he can tell his stomach is smaller He has not had any side effects of the medication He works a very physical job and walking in the evenings with his wife He plans to start working out on his [place] at home  He is tracking his calories and burning 3500-4000 calories a day and walking 63149 steps day   Past medical history, Surgical history, Family history not pertinant except as noted below, Social history, Allergies, and medications have been entered into the medical record, reviewed, and corrections made.   Review of Systems:  All review of systems negative except what is listed in the HPI  Objective:    Alert and oriented x 4 Speaking in clear sentences with no shortness of breath. No distress.  Impression and Recommendations:    Problem List Items Addressed This Visit     Generalized anxiety disorder with panic attacks    Chronic.  Doing very well at this time on current medication regimen no alarm symptoms present today. We will follow-up in 6 months or sooner if needed.        Obesity (BMI 30.0-34.9) - Primary    Chronic.  Patient doing very well on Wegovy.  No alarm symptoms present at this time.  He is tolerating the medication well.  He has not weighed himself at this time so we are unable to quantify his weight loss efforts but he has noticed that his clothes are fitting differently.  Encourage patient to continue with weight loss efforts, dietary changes, and exercise to continue to see effects.  Discussed with patient to notify the office immediately begins to have any symptoms of abdominal pain or other alarming symptoms. We will plan to follow-up in 6 months or sooner if needed.  Okay to send refills as needed.        current treatment plan is effective, no change in therapy I discussed the assessment and treatment plan with the patient. The patient was provided an opportunity to ask questions and all were answered. The patient agreed with the plan and demonstrated an understanding of the instructions.   The patient was advised to call back or seek an in-person evaluation if the symptoms worsen or if the condition fails to improve as anticipated.  Follow-Up: in 6 months  I provided 21 minutes of non-face-to-face interaction with this non face-to-face encounter including intake, same-day documentation, and chart review.   Tollie Eth, NP , DNP, AGNP-c [facility] at [facility] [phone] (fax)

## 2021-10-31 ENCOUNTER — Encounter (HOSPITAL_BASED_OUTPATIENT_CLINIC_OR_DEPARTMENT_OTHER): Payer: Self-pay | Admitting: Nurse Practitioner

## 2021-10-31 NOTE — Assessment & Plan Note (Signed)
Chronic.  Doing very well at this time on current medication regimen no alarm symptoms present today. We will follow-up in 6 months or sooner if needed.

## 2021-10-31 NOTE — Assessment & Plan Note (Signed)
Chronic.  Patient doing very well on Wegovy.  No alarm symptoms present at this time.  He is tolerating the medication well.  He has not weighed himself at this time so we are unable to quantify his weight loss efforts but he has noticed that his clothes are fitting differently.  Encourage patient to continue with weight loss efforts, dietary changes, and exercise to continue to see effects.  Discussed with patient to notify the office immediately begins to have any symptoms of abdominal pain or other alarming symptoms. We will plan to follow-up in 6 months or sooner if needed.  Okay to send refills as needed.

## 2021-11-07 ENCOUNTER — Other Ambulatory Visit (HOSPITAL_COMMUNITY): Payer: Self-pay

## 2021-11-07 ENCOUNTER — Other Ambulatory Visit (HOSPITAL_BASED_OUTPATIENT_CLINIC_OR_DEPARTMENT_OTHER): Payer: Self-pay

## 2021-11-21 ENCOUNTER — Other Ambulatory Visit (HOSPITAL_BASED_OUTPATIENT_CLINIC_OR_DEPARTMENT_OTHER): Payer: Self-pay

## 2021-11-29 ENCOUNTER — Other Ambulatory Visit (HOSPITAL_BASED_OUTPATIENT_CLINIC_OR_DEPARTMENT_OTHER): Payer: Self-pay | Admitting: Nurse Practitioner

## 2021-11-29 DIAGNOSIS — E669 Obesity, unspecified: Secondary | ICD-10-CM

## 2021-11-29 DIAGNOSIS — G4730 Sleep apnea, unspecified: Secondary | ICD-10-CM

## 2021-11-30 ENCOUNTER — Other Ambulatory Visit (HOSPITAL_COMMUNITY): Payer: Self-pay

## 2021-12-03 ENCOUNTER — Other Ambulatory Visit (HOSPITAL_COMMUNITY): Payer: Self-pay

## 2021-12-18 ENCOUNTER — Other Ambulatory Visit (HOSPITAL_COMMUNITY): Payer: Self-pay

## 2021-12-21 ENCOUNTER — Other Ambulatory Visit (HOSPITAL_COMMUNITY): Payer: Self-pay

## 2021-12-21 MED ORDER — WEGOVY 0.5 MG/0.5ML ~~LOC~~ SOAJ
0.5000 mg | SUBCUTANEOUS | 3 refills | Status: DC
  Filled 2021-12-21: qty 2, 28d supply, fill #0

## 2021-12-25 ENCOUNTER — Other Ambulatory Visit (HOSPITAL_COMMUNITY): Payer: Self-pay

## 2021-12-26 NOTE — Progress Notes (Signed)
HPI male never smoker followed for OSA, complicated by Insomnia, Anxiety Disorder, Depression, Obesity HST 09/28/20- AHI 65.9/ hr, desaturation to 69% with 126 minutes </= 89%, body weight 232 lbs PAP titration 12/10/20- CPAP inadequate. Good control on BIPAP 22/18 with minO2 sat 96%  ================================================================    08/27/21- 46 year old male never smoker followed for OSA, complicated by Insomnia, Anxiety Disorder, Depression, Obesity, Rhinitiss,  BIPAP 18/6/ [employer] pressure ordered 05/14/21 Download-not available today Body weight today-244 lbs Covid vax-2 Moderna Flu vax-had [facility] Pol[facility]) 08/07/21> weight loss, work on BiPAP comfort. If BMI gets < 32, then he could qualify for Inspire. BiPAP humidifier runs dry with tubing gurgles.  We discussed adjustment. He comments that [employer] sent an unfamiliar mask.  We discussed referral for mask fitting.  12/27/21- 45 year old male never smoker followed for OSA, complicated by Insomnia, Anxiety Disorder, Depression, Obesity, Rhinitis,  -trazodone,   Note semaglutide for weight BIPAP IPAP18 / EPAP 6/ [employer]            pressure ordered 05/14/21  Download-compliance 73.3%, AHI 33/ hr Body weight today-237 lbs  BMI 32.1 Covid vax-2 Moderna Flu vax-had -----Pt f/u BiPAP doesn't seem to be working well. 6-7hr sleep/night. Pt has concerns that he still isn't feeling well when waking up and can't tell any difference from having the machine We discussed comfort after options.  Still having gas pains from aerophagia making it unlikely he would tolerate a higher pressure. He had tried and on fitted oral appliance earlier with little benefit. He was previously titrated with good control on BiPAP 22/16 but he is not tolerating pressure close to that at home.  His weight is now in the range indicated when he saw ENT/Dr. Pollyann Kennedy, for possible Inspire consideration.  ROS-see HPI   + = positive Constitutional:    weight loss,  night sweats, fevers, chills, fatigue, lassitude. HEENT:    headaches, difficulty swallowing, tooth/dental problems, sore throat,       sneezing, itching, ear ache, +nasal congestion, post nasal drip, snoring CV:    chest pain, orthopnea, PND, swelling in lower extremities, anasarca,       dizziness, palpitations Resp:   shortness of breath with exertion or at rest.                productive cough,   non-productive cough, coughing up of blood.              change in color of mucus.  wheezing.   Skin:    rash or lesions. GI:  +heartburn, indigestion, abdominal pain, nausea, vomiting, diarrhea,                 change in bowel habits, loss of appetite GU: dysuria, change in color of urine, no urgency or frequency.   flank pain. MS:   joint pain, stiffness, decreased range of motion, back pain. Neuro-     nothing unusual Psych:  change in mood or affect.  depression or anxiety.   memory loss.   OBJ- Physical Exam General- Alert, Oriented, Affect-appropriate, Distress- none acute, +Obese Skin- rash-none, lesions- none, excoriation- none Lymphadenopathy- none Head- atraumatic            Eyes- Gross vision intact, PERRLA, conjunctivae and secretions clear            Ears- Hearing, canals-normal            Nose- Clear, no-Septal dev, mucus, polyps, erosion, perforation  Throat- Mallampati IV , mucosa clear , drainage- none, tonsils+, + teeth Neck- flexible , trachea midline, no stridor , thyroid nl, carotid no bruit Chest - symmetrical excursion , unlabored           Heart/CV- RRR , no murmur , no gallop  , no rub, nl s1 s2                           - JVD- none , edema- none, stasis changes- none, varices- none           Lung- clear to P&A, wheeze- none, cough- none , dullness-none, rub- none           Chest wall-  Abd-  Br/ Gen/ Rectal- Not done, not indicated Extrem- cyanosis- none, clubbing, none, atrophy- none, strength- nl Neuro- grossly intact to observation

## 2021-12-27 ENCOUNTER — Ambulatory Visit (INDEPENDENT_AMBULATORY_CARE_PROVIDER_SITE_OTHER): Payer: No Typology Code available for payment source | Admitting: Internal Medicine

## 2021-12-27 ENCOUNTER — Encounter: Payer: Self-pay | Admitting: Internal Medicine

## 2021-12-27 VITALS — BP 110/72 | HR 84 | Ht 72.0 in | Wt 237.7 lb

## 2021-12-27 DIAGNOSIS — E669 Obesity, unspecified: Secondary | ICD-10-CM

## 2021-12-27 DIAGNOSIS — G4730 Sleep apnea, unspecified: Secondary | ICD-10-CM | POA: Diagnosis not present

## 2021-12-27 DIAGNOSIS — G4733 Obstructive sleep apnea (adult) (pediatric): Secondary | ICD-10-CM | POA: Diagnosis not present

## 2021-12-27 NOTE — Patient Instructions (Signed)
Order- referral to Dr [facility] consider Marc Wiggins for OSA  Consider retrying your otc oral appliance along with your BIPAP. See if you get better control with this.  Keep on working your weight down- that will help with any of the treatments.

## 2022-01-15 ENCOUNTER — Other Ambulatory Visit: Payer: Self-pay | Admitting: Osteopathic Medicine

## 2022-01-16 MED ORDER — DULOXETINE HCL 20 MG PO CPEP
40.0000 mg | ORAL_CAPSULE | Freq: Every day | ORAL | 3 refills | Status: DC
  Filled 2022-01-16: qty 180, 90d supply, fill #0
  Filled 2022-04-14: qty 180, 90d supply, fill #1
  Filled 2022-07-25: qty 180, 90d supply, fill #2
  Filled 2022-10-22: qty 180, 90d supply, fill #3

## 2022-01-17 ENCOUNTER — Other Ambulatory Visit (HOSPITAL_COMMUNITY): Payer: Self-pay

## 2022-02-04 ENCOUNTER — Ambulatory Visit (INDEPENDENT_AMBULATORY_CARE_PROVIDER_SITE_OTHER): Payer: No Typology Code available for payment source | Admitting: Nurse Practitioner

## 2022-02-04 ENCOUNTER — Encounter (HOSPITAL_BASED_OUTPATIENT_CLINIC_OR_DEPARTMENT_OTHER): Payer: Self-pay | Admitting: Nurse Practitioner

## 2022-02-04 ENCOUNTER — Other Ambulatory Visit (HOSPITAL_COMMUNITY): Payer: Self-pay

## 2022-02-04 VITALS — BP 115/73 | HR 83 | Ht 72.0 in | Wt 229.3 lb

## 2022-02-04 DIAGNOSIS — F3342 Major depressive disorder, recurrent, in full remission: Secondary | ICD-10-CM

## 2022-02-04 DIAGNOSIS — E66811 Obesity, class 1: Secondary | ICD-10-CM

## 2022-02-04 DIAGNOSIS — G4730 Sleep apnea, unspecified: Secondary | ICD-10-CM

## 2022-02-04 DIAGNOSIS — F5105 Insomnia due to other mental disorder: Secondary | ICD-10-CM

## 2022-02-04 DIAGNOSIS — Z0000 Encounter for general adult medical examination without abnormal findings: Secondary | ICD-10-CM

## 2022-02-04 DIAGNOSIS — E669 Obesity, unspecified: Secondary | ICD-10-CM

## 2022-02-04 DIAGNOSIS — J3089 Other allergic rhinitis: Secondary | ICD-10-CM | POA: Diagnosis not present

## 2022-02-04 MED ORDER — MONTELUKAST SODIUM 10 MG PO TABS
10.0000 mg | ORAL_TABLET | Freq: Every day | ORAL | 3 refills | Status: DC
  Filled 2022-02-04 – 2022-03-05 (×2): qty 90, 90d supply, fill #0
  Filled 2022-07-25: qty 90, 90d supply, fill #1
  Filled 2022-10-22: qty 90, 90d supply, fill #2
  Filled 2023-01-24: qty 90, 90d supply, fill #3

## 2022-02-04 MED ORDER — TRAZODONE HCL 150 MG PO TABS
150.0000 mg | ORAL_TABLET | Freq: Every evening | ORAL | 3 refills | Status: DC | PRN
  Filled 2022-02-04 – 2022-03-05 (×2): qty 90, 90d supply, fill #0
  Filled 2022-06-01: qty 90, 90d supply, fill #1
  Filled 2022-09-11: qty 90, 90d supply, fill #2
  Filled 2022-12-10: qty 90, 90d supply, fill #3

## 2022-02-04 MED ORDER — WEGOVY 1 MG/0.5ML ~~LOC~~ SOAJ
1.0000 mg | SUBCUTANEOUS | 3 refills | Status: DC
  Filled 2022-02-04: qty 2, 28d supply, fill #0
  Filled 2022-03-11: qty 2, 28d supply, fill #1
  Filled 2022-04-14: qty 2, 28d supply, fill #2

## 2022-02-04 NOTE — Patient Instructions (Signed)
It was a pleasure seeing you today. I hope your time spent with [place] was pleasant and helpful. Please let us know if there is anything we can do to improve the service you receive.    I have sent refills of the medications that appeared to be running out soon. I have also increased the Wegovy to the 1mg  dose.  I will be in touch you once we get your labs back and if we need to make any changes I will let you know.     Important Office Information Lab Results If labs were ordered, please note that you will see results through MyChart as soon as they come available from [facility].  It takes up to 5 business days for the results to be routed to me and for me to review them once all of the lab results have come through from [facility]. I will make recommendations based on your results and send these through MyChart or someone from the office will call you to discuss. If your labs are abnormal, we may contact you to schedule a visit to discuss the results and make recommendations.  If you have not heard from [facility] within 5 business days or you have waited longer than a week and your lab results have not come through on MyChart, please feel free to call the office or send a message through MyChart to follow-up on these labs.   Referrals If referrals were placed today, the office where the referral was sent will contact you either by phone or through MyChart to set up scheduling. Please note that it can take up to a week for the referral office to contact you. If you do not hear from them in a week, please contact the referral office directly to inquire about scheduling.   Condition Treated If your condition worsens or you begin to have new symptoms, please schedule a follow-up appointment for further evaluation. If you are not sure if an appointment is needed, you may call the office to leave a message for the nurse and someone will contact you with recommendations.  If you have an urgent or life threatening  emergency, please do not call the office, but seek emergency evaluation by calling 911 or going to the nearest emergency room for evaluation.   MyChart and Phone Calls Please do not use MyChart for urgent messages. It may take up to 3 business days for MyChart messages to be read by staff and if they are unable to handle the request, an additional 3 business days for them to be routed to me and for my response.  Messages sent to the provider through MyChart do not come directly to the provider, please allow time for these messages to be routed and for me to respond.  We get a large volume of MyChart messages daily and these are responded to in the order received.   For urgent messages, please call the office at [phone] and speak with the front office staff or leave a message on the line of my assistant for guidance.  We are seeing patients from the hours of 8:00 am through 5:00 pm and calls directly to the nurse may not be answered immediately due to seeing patients, but your call will be returned as soon as possible.  Phone  messages received after 4:00 PM Monday through Thursday may not be returned until the following business day. Phone messages received after 11:00 AM on Friday may not be returned until Monday.  After Hours We share on call hours with providers from other offices. If you have an urgent need after hours that cannot wait until the next business day, please contact the on call provider by calling the office number. A nurse will speak with you and contact the provider if needed for recommendations.  If you have an urgent or life threatening emergency after hours, please do not call the on call provider, but seek emergency evaluation by calling 911 or going to the nearest emergency room for evaluation.   Paperwork All paperwork requires a minimum of 5 days to complete and return to you or the designated personnel. Please keep this in mind when bringing in forms or sending  requests for paperwork completion to the office.

## 2022-02-04 NOTE — Progress Notes (Signed)
BP 115/73   Pulse 83   Ht 6' (1.829 m)   Wt 229 lb 4.8 oz (104 kg)   SpO2 99%   BMI 31.10 kg/m    Subjective:    Patient ID: Marc Wiggins, male    DOB: Mar 15, 1976, 46 y.o.   MRN: [id number]  HPI: Marc Wiggins is a 46 y.o. male presenting on 02/04/2022 for comprehensive medical examination.   Current medical concerns include:{Blank single:19197::"none","***"}  He reports regular vision exams q1-5y: {Blank single:19197::"yes","no"} He reports regular dental exams q 45m {Blank single:19197::"yes","no"} His diet consists of: {Blank single:19197::"***"} He endorses exercise and/or activity of: {Blank single:19197::"***"} He works as: {Blank single:19197::"***"}  He {Blank single:19197::"denies","endorses"} ETOH use ({Blank single:19197::"***"}) He {Blank single:19197::"denies","endorses"} nictoine use ({Blank single:19197::"***"}) He {Blank single:19197::"denies","endorses"} illegal substance use ({Blank single:19197::"***"}) He is {sexual partners:315163}  He {Blank single:19197::"denies","endorses"} concerns today about STI  He {Blank single:19197::"endorses","denies"} concerns about skin changes today ({SKIN/BREAST RUPJ:03159} He {Blank single:19197::"endorses","denies"} concerns about bowel changes today ({SYMPTOMS; BYVOPF:29244} He {Blank single:19197::"endorses","denies"} concerns about bladder changes today ({Symptoms; bladder:10412})  Most Recent Depression Screen:     07/05/2021    6:37 PM 12/07/2020    2:10 PM 03/23/2020    9:25 AM 02/24/2020   10:24 AM 02/10/2020    8:01 AM  Depression screen PHQ 2/9  Decreased Interest 0 0 0 0 1  Down, Depressed, Hopeless 0 0 0 0 0  PHQ - 2 Score 0 0 0 0 1  Altered sleeping 1  0 1 3  Tired, decreased energy 1  1 0 0  Change in appetite 0  0 0 1  Feeling bad or failure about yourself  0  0 0 0  Trouble concentrating 1  1 0 1  Moving slowly or fidgety/restless 0  1 0 0  Suicidal thoughts 0  0 0 0  PHQ-9 Score '3  3 1 6   ' Difficult doing work/chores Not difficult at all  Not difficult at all Somewhat difficult Somewhat difficult   Most Recent Anxiety Screen:    03/23/2020    9:24 AM 02/24/2020   10:25 AM 02/10/2020    8:03 AM 11/17/2019    8:34 AM  GAD 7 : Generalized Anxiety Score  Nervous, Anxious, on Edge '1 2 3 2  ' Control/stop worrying '1 2 3 2  ' Worry too much - different things '1 2 3 2  ' Trouble relaxing '1 2 3 2  ' Restless 0 '1 2 1  ' Easily annoyed or irritable '1 1 1 ' 0  Afraid - awful might happen 0 '1 1 1  ' Total GAD 7 Score '5 11 16 10  ' Anxiety Difficulty Not difficult at all Somewhat difficult Somewhat difficult Somewhat difficult   Most Recent Falls Screen:    10/30/2021    3:56 PM 07/05/2021    6:37 PM 07/05/2021    2:24 PM 12/07/2020    2:10 PM  [place]in the past year? 0 0 0 0  Number falls in past yr: 0 0 0 0  Injury with Fall? 0 0 0 0  Risk for fall due to : No Fall Risks No Fall Risks No Fall Risks No Fall Risks  Follow up Falls evaluation completed Falls evaluation completed Falls evaluation completed Falls evaluation completed    Past medical history, surgical history, medications, allergies, family history and social history reviewed with patient today and changes made to appropriate areas of the chart.  Past Medical History:  Past Medical History:  Diagnosis Date  .  Current moderate episode of major depressive disorder without prior episode ([place]) 06/29/2018  . Generalized anxiety disorder with panic attacks   . Recurrent major depressive disorder, in full remission ([place]) 07/05/2021  . Severe sleep apnea    Medications:  Current Outpatient Medications on File Prior to Visit  Medication Sig  . AMBULATORY NON FORMULARY MEDICATION Supply ordered: BiPAP therapy on settings: 22/18 cm H2O. Please include other supplies needed (headgear, cushions, filters, heated tuubing and water chamber) Dx: obstructive sleep apnea  . DULoxetine (CYMBALTA) 20 MG capsule Take 2 capsules (40 mg  total) by mouth daily.  . fexofenadine (ALLEGRA) 180 MG tablet Take 1 tablet (180 mg total) by mouth daily.  . montelukast (SINGULAIR) 10 MG tablet Take 1 tablet (10 mg total) by mouth at bedtime.  . Multiple Vitamin (MULTIVITAMIN) tablet Take 1 tablet daily by mouth.  . ondansetron (ZOFRAN) 8 MG tablet Take 1 tablet (8 mg total) by mouth every 8 (eight) hours as needed for nausea or vomiting.  . Semaglutide-Weight Management (WEGOVY) 0.5 MG/0.5ML SOAJ Inject 0.5 mg into the skin once a week. Continue on 0.41m dose until no longer effective for weight management/appetite suppression.  . traZODone (DESYREL) 150 MG tablet Take 1 tablet (150 mg total) by mouth at bedtime as needed for sleep   No current facility-administered medications on file prior to visit.   Surgical History:  Past Surgical History:  Procedure Laterality Date  . CHOLECYSTECTOMY    . ENDOVENOUS ABLATION SAPHENOUS VEIN W/ LASER Right 04/29/2019   endovenous laser ablation right greater saphenous vein and stab phlebectomy 10-20 incisions right leg by CGae GallopMD    Allergies:  Allergies  Allergen Reactions  . Hydrocodone    Social History:  Social History   Socioeconomic History  . Marital status: Married    Spouse name: Not on file  . Number of children: Not on file  . Years of education: Not on file  . Highest education level: Not on file  Occupational History  . Not on file  Tobacco Use  . Smoking status: Never  . Smokeless tobacco: Never  Vaping Use  . Vaping Use: Never used  Substance and Sexual Activity  . Alcohol use: Never  . Drug use: Never  . Sexual activity: Yes  Other Topics Concern  . Not on file  Social History Narrative  . Not on file   Social Determinants of Health   Financial Resource Strain: Not on file  Food Insecurity: Not on file  Transportation Needs: Not on file  Physical Activity: Not on file  Stress: Not on file  Social Connections: Not on file  Intimate Partner  Violence: Not on file   Social History   Tobacco Use  Smoking Status Never  Smokeless Tobacco Never   Social History   Substance and Sexual Activity  Alcohol Use Never   Family History:  Family History  Problem Relation Age of Onset  . Hypertension Father   . Diabetes Maternal Aunt   . Stroke Maternal Grandmother   . Diabetes Maternal Grandmother   . Heart attack Maternal Grandmother   . Cancer Paternal Grandmother      All ROS negative except what is listed above and in the HPI.      Objective:    BP 115/73   Pulse 83   Ht 6' (1.829 m)   Wt 229 lb 4.8 oz (104 kg)   SpO2 99%   BMI 31.10 kg/m   Wt Readings from Last  3 Encounters:  02/04/22 229 lb 4.8 oz (104 kg)  12/27/21 237 lb 11.2 oz (107.8 kg)  10/11/21 240 lb (108.9 kg)    Physical Exam  Results for orders placed or performed in visit on 07/05/21  Comprehensive metabolic panel  Result Value Ref Range   Glucose 80 70 - 99 mg/dL   BUN 19 6 - 24 mg/dL   Creatinine, Ser 1.21 0.76 - 1.27 mg/dL   eGFR 76 >59 mL/min/1.73   BUN/Creatinine Ratio 16 9 - 20   Sodium 142 134 - 144 mmol/L   Potassium 4.6 3.5 - 5.2 mmol/L   Chloride 102 96 - 106 mmol/L   CO2 27 20 - 29 mmol/L   Calcium 9.6 8.7 - 10.2 mg/dL   Total Protein 7.1 6.0 - 8.5 g/dL   Albumin 4.6 4.0 - 5.0 g/dL   Globulin, Total 2.5 1.5 - 4.5 g/dL   Albumin/Globulin Ratio 1.8 1.2 - 2.2   Bilirubin Total 0.4 0.0 - 1.2 mg/dL   Alkaline Phosphatase 101 44 - 121 IU/L   AST 35 0 - 40 IU/L   ALT 40 0 - 44 IU/L  Lipid panel  Result Value Ref Range   Cholesterol, Total 215 (H) 100 - 199 mg/dL   Triglycerides 73 0 - 149 mg/dL   HDL 82 >39 mg/dL   VLDL Cholesterol Cal 13 5 - 40 mg/dL   LDL Chol Calc (NIH) 120 (H) 0 - 99 mg/dL   Chol/HDL Ratio 2.6 0.0 - 5.0 ratio  Hemoglobin A1c  Result Value Ref Range   Hgb A1c MFr Bld 5.3 4.8 - 5.6 %   Est. average glucose Bld gHb Est-mCnc 105 mg/dL      Assessment & Plan:   Problem List Items Addressed This Visit    None    Follow up plan: NEXT PREVENTATIVE PHYSICAL DUE IN 1 YEAR. No follow-ups on file.  LABORATORY TESTING:  Health maintenance labs ordered today, if applicable.  - STI testing: {Blank WKGSUP:10315::"XYVOPFYT","WKMQ today","not applicable","up to date","done elsewhere"}  IMMUNIZATIONS:   - Tdap: Tetanus vaccination status reviewed: {tetanus status:315746}. - Influenza: {Blank single:19197::"Up to date","Administered today","Postponed to flu season","Refused","Given elsewhere"} - Pneumovax: {Blank single:19197::"Up to date","Administered today","Not applicable","Refused","Given elsewhere"} - Prevnar: {Blank single:19197::"Up to date","Administered today","Not applicable","Refused","Given elsewhere"} - HPV: {Blank single:19197::"Up to date","Administered today","Not applicable","Refused","Given elsewhere"} - Zostavax vaccine: {Blank single:19197::"Up to date","Administered today","Not applicable","Refused","Given elsewhere"}  SCREENING: - Colonoscopy: {Blank single:19197::"Up to date","Ordered today","Not applicable","Refused","Done elsewhere"}  Discussed with patient purpose of the colonoscopy is to detect colon cancer at curable precancerous or Sankalp Ferrell stages  - AAA Screening: {Blank single:19197::"Up to date","Ordered today","Not applicable","Refused","Done elsewhere"}  - Hearing Test: {Blank single:19197::"Up to date","Ordered today","Not applicable","Refused","Done elsewhere"}  - Spirometry: {Blank single:19197::"Up to date","Ordered today","Not applicable","Refused","Done elsewhere"}  - PSA: {Blank single:19197::"Up to date","Ordered today","Not applicable","Refused","Done elsewhere"}   PATIENT COUNSELING:   For all adult patients, I recommend A well balanced diet low in saturated fats, cholesterol, and moderation in carbohydrates.   This can be as simple as monitoring portion sizes and cutting back on sugary beverages such as soda and juice to start with.    Daily water  consumption of at least 64 ounces.  Physical activity at least 180 minutes per week, if just starting out.   This can be as simple as taking the stairs instead of the elevator and walking 2-3 laps around the office  purposefully every day.   STD protection, partner selection, and regular testing if high risk.  Limited consumption of alcoholic beverages if alcohol  is consumed.  For women, I recommend no more than 7 alcoholic beverages per week, spread out throughout the week.  Avoid "binge" drinking or consuming large quantities of alcohol in one setting.   Please let me know if you feel you may need help with reduction or quitting alcohol consumption.   Avoidance of nicotine, if used.  Please let me know if you feel you may need help with reduction or quitting nicotine use.   Daily mental health attention.  This can be in the form of 5 minute daily meditation, prayer, journaling, yoga, reflection, etc.   Purposeful attention to your emotions and mental state can significantly improve your overall wellbeing  and  Health.  Please know that I am here to help you with all of your health care goals and am happy to work with you to find a solution that works best for you.  The greatest advice I have received with any changes in life are to take it one step at a time, that even means if all you can focus on is the next 60 seconds, then do that and celebrate your victories.  With any changes in life, you will have set backs, and that is OK. The important thing to remember is, if you have a set back, it is not a failure, it is an opportunity to try again!  Health Maintenance Recommendations Screening Testing Mammogram Every 1 -2 years based on history and risk factors Starting at age 52 Pap Smear Ages 21-39 every 3 years Ages 49-65 every 5 years with HPV testing More frequent testing may be required based on results and history Colon Cancer Screening Every 1-10 years based on test performed,  risk factors, and history Starting at age 65 Bone Density Screening Every 2-10 years based on history Starting at age 83 for women Recommendations for men differ based on medication usage, history, and risk factors AAA Screening One time ultrasound Men 67-70 years old who have every smoked Lung Cancer Screening Low Dose Lung CT every 12 months Age 18-80 years with a 30 pack-year smoking history who still smoke or who have quit within the last 15 years  Screening Labs Routine  Labs: Complete Blood Count (CBC), Complete Metabolic Panel (CMP), Cholesterol (Lipid Panel) Every 6-12 months based on history and medications May be recommended more frequently based on current conditions or previous results Hemoglobin A1c Lab Every 3-12 months based on history and previous results Starting at age 67 or earlier with diagnosis of diabetes, high cholesterol, BMI >26, and/or risk factors Frequent monitoring for patients with diabetes to ensure blood sugar control Thyroid Panel (TSH w/ T3 & T4) Every 6 months based on history, symptoms, and risk factors May be repeated more often if on medication HIV One time testing for all patients 72 and older May be repeated more frequently for patients with increased risk factors or exposure Hepatitis C One time testing for all patients 46 and older May be repeated more frequently for patients with increased risk factors or exposure Gonorrhea, Chlamydia Every 12 months for all sexually active persons 13-24 years Additional monitoring may be recommended for those who are considered high risk or who have symptoms PSA Men 36-34 years old with risk factors Additional screening may be recommended from age 62-69 based on risk factors, symptoms, and history  Vaccine Recommendations Tetanus Booster All adults every 10 years Flu Vaccine All patients 6 months and older every year COVID Vaccine All patients 12 years and older Initial  dosing with booster May  recommend additional booster based on age and health history HPV Vaccine 2 doses all patients age 35-26 Dosing may be considered for patients over 26 Shingles Vaccine (Shingrix) 2 doses all adults 10 years and older Pneumonia (Pneumovax 23) All adults 78 years and older May recommend earlier dosing based on health history Pneumonia (Prevnar 13) All adults 20 years and older Dosed 1 year after Pneumovax 23  Additional Screening, Testing, and Vaccinations may be recommended on an individualized basis based on family history, health history, risk factors, and/or exposure.

## 2022-02-05 LAB — COMPREHENSIVE METABOLIC PANEL
ALT: 18 IU/L (ref 0–44)
AST: 20 IU/L (ref 0–40)
Albumin/Globulin Ratio: 2.1 (ref 1.2–2.2)
Albumin: 4.5 g/dL (ref 4.1–5.1)
Alkaline Phosphatase: 96 IU/L (ref 44–121)
BUN/Creatinine Ratio: 13 (ref 9–20)
BUN: 16 mg/dL (ref 6–24)
Bilirubin Total: 0.3 mg/dL (ref 0.0–1.2)
CO2: 27 mmol/L (ref 20–29)
Calcium: 9.4 mg/dL (ref 8.7–10.2)
Chloride: 103 mmol/L (ref 96–106)
Creatinine, Ser: 1.22 mg/dL (ref 0.76–1.27)
Globulin, Total: 2.1 g/dL (ref 1.5–4.5)
Glucose: 91 mg/dL (ref 70–99)
Potassium: 4.7 mmol/L (ref 3.5–5.2)
Sodium: 142 mmol/L (ref 134–144)
Total Protein: 6.6 g/dL (ref 6.0–8.5)
eGFR: 75 mL/min/{1.73_m2} (ref >59)

## 2022-02-05 LAB — CBC WITH DIFF/PLATELET
Basophils Absolute: 0 10*3/uL (ref 0.0–0.2)
Basos: 1 %
EOS (ABSOLUTE): 0.1 10*3/uL (ref 0.0–0.4)
Eos: 1 %
Hematocrit: 44.6 % (ref 37.5–51.0)
Hemoglobin: 14.9 g/dL (ref 13.0–17.7)
Immature Grans (Abs): 0 10*3/uL (ref 0.0–0.1)
Immature Granulocytes: 0 %
Lymphocytes Absolute: 2 10*3/uL (ref 0.7–3.1)
Lymphs: 28 %
MCH: 31.4 pg (ref 26.6–33.0)
MCHC: 33.4 g/dL (ref 31.5–35.7)
MCV: 94 fL (ref 79–97)
Monocytes Absolute: 0.7 10*3/uL (ref 0.1–0.9)
Monocytes: 9 %
Neutrophils Absolute: 4.4 10*3/uL (ref 1.4–7.0)
Neutrophils: 61 %
Platelets: 251 10*3/uL (ref 150–450)
RBC: 4.75 x10E6/uL (ref 4.14–5.80)
RDW: 12.2 % (ref 11.6–15.4)
WBC: 7.1 10*3/uL (ref 3.4–10.8)

## 2022-02-05 LAB — HEMOGLOBIN A1C
Est. average glucose Bld gHb Est-mCnc: 100 mg/dL
Hgb A1c MFr Bld: 5.1 % (ref 4.8–5.6)

## 2022-02-05 LAB — LIPID PANEL
Chol/HDL Ratio: 2.2 ratio (ref 0.0–5.0)
Cholesterol, Total: 176 mg/dL (ref 100–199)
HDL: 79 mg/dL (ref >39)
LDL Chol Calc (NIH): 83 mg/dL (ref 0–99)
Triglycerides: 75 mg/dL (ref 0–149)
VLDL Cholesterol Cal: 14 mg/dL (ref 5–40)

## 2022-02-05 LAB — VITAMIN D 25 HYDROXY (VIT D DEFICIENCY, FRACTURES): Vit D, 25-Hydroxy: 38.8 ng/mL (ref 30.0–100.0)

## 2022-02-06 DIAGNOSIS — Z0000 Encounter for general adult medical examination without abnormal findings: Secondary | ICD-10-CM

## 2022-02-06 HISTORY — DX: Encounter for general adult medical examination without abnormal findings: Z00.00

## 2022-02-06 NOTE — Assessment & Plan Note (Signed)
Chronic.  Continue to work on weight loss.  No alarm symptoms present at this time.

## 2022-02-06 NOTE — Assessment & Plan Note (Signed)
Chronic.  Currently controlled with montelukast.  No alarm symptoms present.  Refills provided today.

## 2022-02-06 NOTE — Assessment & Plan Note (Signed)
Chronic.  Doing very well on Wegovy for weight management.  We will plan to increase dose to 1 mg and monitor closely.  Recommend continuation of diet and exercise modifications to help with weight management.

## 2022-02-06 NOTE — Assessment & Plan Note (Signed)
Stable with no alarm symptoms present.  Continue current treatment.

## 2022-02-06 NOTE — Assessment & Plan Note (Signed)
Well-controlled with trazodone.  We will send refill today.  No alarm symptoms present at this time.

## 2022-02-06 NOTE — Assessment & Plan Note (Signed)
CPE today with labs.  No alarm symptoms present at this time.  Diet and exercise recommendations provided as well as health maintenance recommendations.  Will obtain labs today and determine follow-up based on findings.

## 2022-02-08 NOTE — Assessment & Plan Note (Signed)
Encouraged to continue working on this.

## 2022-02-08 NOTE — Assessment & Plan Note (Signed)
He is not getting adequate control although his AHI at 33 is better than untreated (65.9/hour).  He does not tolerate BiPAP pressure close to the range that titration study showed was effective for control. Plan-he can experiment with whether his on fitted oral appliance together with BiPAP makes any improvement.  We are referring back to ENT to reconsider Inspire.

## 2022-02-14 ENCOUNTER — Other Ambulatory Visit (HOSPITAL_COMMUNITY): Payer: Self-pay

## 2022-02-14 DIAGNOSIS — J343 Hypertrophy of nasal turbinates: Secondary | ICD-10-CM | POA: Insufficient documentation

## 2022-02-14 MED ORDER — FLUTICASONE PROPIONATE 50 MCG/ACT NA SUSP
NASAL | 5 refills | Status: AC
  Filled 2022-02-14: qty 16, 30d supply, fill #0

## 2022-02-19 ENCOUNTER — Telehealth: Payer: No Typology Code available for payment source | Admitting: Family Medicine

## 2022-02-19 DIAGNOSIS — J019 Acute sinusitis, unspecified: Secondary | ICD-10-CM | POA: Diagnosis not present

## 2022-02-19 DIAGNOSIS — B9689 Other specified bacterial agents as the cause of diseases classified elsewhere: Secondary | ICD-10-CM

## 2022-02-19 MED ORDER — AMOXICILLIN-POT CLAVULANATE 875-125 MG PO TABS: 1.0000 | ORAL_TABLET | Freq: Two times a day (BID) | ORAL | 0 refills | Status: AC

## 2022-02-19 NOTE — Progress Notes (Signed)

## 2022-03-05 ENCOUNTER — Other Ambulatory Visit (HOSPITAL_COMMUNITY): Payer: Self-pay

## 2022-03-11 ENCOUNTER — Other Ambulatory Visit (HOSPITAL_COMMUNITY): Payer: Self-pay

## 2022-03-12 ENCOUNTER — Other Ambulatory Visit: Payer: Self-pay | Admitting: Otolaryngology

## 2022-03-27 ENCOUNTER — Encounter (HOSPITAL_BASED_OUTPATIENT_CLINIC_OR_DEPARTMENT_OTHER): Payer: Self-pay

## 2022-03-27 ENCOUNTER — Ambulatory Visit (HOSPITAL_BASED_OUTPATIENT_CLINIC_OR_DEPARTMENT_OTHER): Admit: 2022-03-27 | Payer: No Typology Code available for payment source | Admitting: Otolaryngology

## 2022-03-27 SURGERY — DRUG INDUCED SLEEP ENDOSCOPY
Anesthesia: General | Laterality: Bilateral

## 2022-04-15 ENCOUNTER — Other Ambulatory Visit (HOSPITAL_COMMUNITY): Payer: Self-pay

## 2022-04-24 ENCOUNTER — Other Ambulatory Visit (HOSPITAL_COMMUNITY): Payer: Self-pay

## 2022-04-28 NOTE — Progress Notes (Deleted)
HPI male never smoker followed for OSA, complicated by Insomnia, Anxiety Disorder, Depression, Obesity HST 09/28/20- AHI 65.9/ hr, desaturation to 69% with 126 minutes </= 89%, body weight 232 lbs PAP titration 12/10/20- CPAP inadequate. Good control on BIPAP 22/18 with minO2 sat 96%  ================================================================    12/27/21- 46 year old male never smoker followed for OSA, complicated by Insomnia, Anxiety Disorder, Depression, Obesity, Rhinitis,  -trazodone,   Note semaglutide for weight BIPAP IPAP18 / EPAP 6/ [employer]            pressure ordered 05/14/21  Download-compliance 73.3%, AHI 33/ hr Body weight today-237 lbs  BMI 32.1 Covid vax-2 Moderna Flu vax-had -----Pt f/u BiPAP doesn't seem to be working well. 6-7hr sleep/night. Pt has concerns that he still isn't feeling well when waking up and can't tell any difference from having the machine We discussed comfort after options.  Still having gas pains from aerophagia making it unlikely he would tolerate a higher pressure. He had tried and on fitted oral appliance earlier with little benefit. He was previously titrated with good control on BiPAP 22/16 but he is not tolerating pressure close to that at home.  His weight is now in the range indicated when he saw ENT/Dr. Pollyann Kennedy, for possible Inspire consideration.  04/30/22- 46 year old male never smoker followed for OSA, complicated by Insomnia, Anxiety Disorder, Depression, Obesity, Rhinitis,  -trazodone,   Note semaglutide for weight BIPAP IPAP18 / EPAP 6/ [employer]            pressure ordered 05/14/21  Download-compliance  Body weight today- Covid vax-2 Moderna Flu vax-had Pending Inspire surgery in January    ROS-see HPI   + = positive Constitutional:    weight loss, night sweats, fevers, chills, fatigue, lassitude. HEENT:    headaches, difficulty swallowing, tooth/dental problems, sore throat,       sneezing, itching, ear ache, +nasal congestion, post  nasal drip, snoring CV:    chest pain, orthopnea, PND, swelling in lower extremities, anasarca,       dizziness, palpitations Resp:   shortness of breath with exertion or at rest.                productive cough,   non-productive cough, coughing up of blood.              change in color of mucus.  wheezing.   Skin:    rash or lesions. GI:  +heartburn, indigestion, abdominal pain, nausea, vomiting, diarrhea,                 change in bowel habits, loss of appetite GU: dysuria, change in color of urine, no urgency or frequency.   flank pain. MS:   joint pain, stiffness, decreased range of motion, back pain. Neuro-     nothing unusual Psych:  change in mood or affect.  depression or anxiety.   memory loss.   OBJ- Physical Exam General- Alert, Oriented, Affect-appropriate, Distress- none acute, +Obese Skin- rash-none, lesions- none, excoriation- none Lymphadenopathy- none Head- atraumatic            Eyes- Gross vision intact, PERRLA, conjunctivae and secretions clear            Ears- Hearing, canals-normal            Nose- Clear, no-Septal dev, mucus, polyps, erosion, perforation             Throat- Mallampati IV , mucosa clear , drainage- none, tonsils+, + teeth Neck- flexible , trachea midline, no  stridor , thyroid nl, carotid no bruit Chest - symmetrical excursion , unlabored           Heart/CV- RRR , no murmur , no gallop  , no rub, nl s1 s2                           - JVD- none , edema- none, stasis changes- none, varices- none           Lung- clear to P&A, wheeze- none, cough- none , dullness-none, rub- none           Chest wall-  Abd-  Br/ Gen/ Rectal- Not done, not indicated Extrem- cyanosis- none, clubbing, none, atrophy- none, strength- nl Neuro- grossly intact to observation

## 2022-04-30 ENCOUNTER — Ambulatory Visit: Payer: No Typology Code available for payment source | Admitting: Internal Medicine

## 2022-05-01 ENCOUNTER — Other Ambulatory Visit (HOSPITAL_COMMUNITY): Payer: Self-pay

## 2022-05-03 ENCOUNTER — Other Ambulatory Visit (HOSPITAL_COMMUNITY): Payer: Self-pay

## 2022-05-04 ENCOUNTER — Other Ambulatory Visit (HOSPITAL_COMMUNITY): Payer: Self-pay

## 2022-05-07 ENCOUNTER — Encounter: Payer: Self-pay | Admitting: Nurse Practitioner

## 2022-05-10 ENCOUNTER — Other Ambulatory Visit (HOSPITAL_COMMUNITY): Payer: Self-pay

## 2022-05-25 ENCOUNTER — Other Ambulatory Visit (HOSPITAL_COMMUNITY): Payer: Self-pay

## 2022-05-31 ENCOUNTER — Other Ambulatory Visit: Payer: Self-pay

## 2022-05-31 ENCOUNTER — Encounter (HOSPITAL_BASED_OUTPATIENT_CLINIC_OR_DEPARTMENT_OTHER): Payer: Self-pay | Admitting: Otolaryngology

## 2022-06-04 ENCOUNTER — Other Ambulatory Visit (HOSPITAL_COMMUNITY): Payer: Self-pay

## 2022-06-10 NOTE — Anesthesia Preprocedure Evaluation (Addendum)
Anesthesia Evaluation  Patient identified by MRN, date of birth, ID band Patient awake    Reviewed: Allergy & Precautions, NPO status , Patient's Chart, lab work & pertinent test results  History of Anesthesia Complications Negative for: history of anesthetic complications  Airway Mallampati: II  TM Distance: >3 FB Neck ROM: Full    Dental  (+) Dental Advisory Given, Teeth Intact   Pulmonary sleep apnea (does not tolerate CPAP)    breath sounds clear to auscultation       Cardiovascular negative cardio ROS  Rhythm:Regular Rate:Normal     Neuro/Psych   Anxiety Depression    negative neurological ROS     GI/Hepatic negative GI ROS, Neg liver ROS,,,  Endo/Other  OVPCHE: last dose 6 weeks ago BMI 32.7  Renal/GU negative Renal ROS     Musculoskeletal   Abdominal  (+) + obese  Peds  Hematology negative hematology ROS (+)   Anesthesia Other Findings   Reproductive/Obstetrics                              Anesthesia Physical Anesthesia Plan  ASA: 3  Anesthesia Plan: MAC   Post-op Pain Management: Minimal or no pain anticipated   Induction:   PONV Risk Score and Plan: 1 and Ondansetron  Airway Management Planned: Natural Airway  Additional Equipment: None  Intra-op Plan:   Post-operative Plan:   Informed Consent: I have reviewed the patients History and Physical, chart, labs and discussed the procedure including the risks, benefits and alternatives for the proposed anesthesia with the patient or authorized representative who has indicated his/her understanding and acceptance.     Dental advisory given  Plan Discussed with: CRNA and Surgeon  Anesthesia Plan Comments:          Anesthesia Quick Evaluation

## 2022-06-11 ENCOUNTER — Ambulatory Visit (HOSPITAL_BASED_OUTPATIENT_CLINIC_OR_DEPARTMENT_OTHER): Payer: 59 | Admitting: Anesthesiology

## 2022-06-11 ENCOUNTER — Encounter (HOSPITAL_BASED_OUTPATIENT_CLINIC_OR_DEPARTMENT_OTHER)
Admission: RE | Disposition: A | Discharge status: Home / Self Care | Payer: Self-pay | Source: Home / Self Care | Attending: Otolaryngology

## 2022-06-11 ENCOUNTER — Other Ambulatory Visit: Payer: Self-pay

## 2022-06-11 ENCOUNTER — Ambulatory Visit (HOSPITAL_BASED_OUTPATIENT_CLINIC_OR_DEPARTMENT_OTHER)
Admission: RE | Admit: 2022-06-11 | Discharge: 2022-06-11 | Disposition: A | Discharge status: Home / Self Care | Payer: 59 | Attending: Otolaryngology | Admitting: Otolaryngology

## 2022-06-11 ENCOUNTER — Encounter (HOSPITAL_BASED_OUTPATIENT_CLINIC_OR_DEPARTMENT_OTHER): Payer: Self-pay | Admitting: Otolaryngology

## 2022-06-11 DIAGNOSIS — G4733 Obstructive sleep apnea (adult) (pediatric): Secondary | ICD-10-CM | POA: Insufficient documentation

## 2022-06-11 DIAGNOSIS — Z789 Other specified health status: Secondary | ICD-10-CM | POA: Insufficient documentation

## 2022-06-11 DIAGNOSIS — F32A Depression, unspecified: Secondary | ICD-10-CM | POA: Insufficient documentation

## 2022-06-11 DIAGNOSIS — F418 Other specified anxiety disorders: Secondary | ICD-10-CM

## 2022-06-11 DIAGNOSIS — Z6832 Body mass index (BMI) 32.0-32.9, adult: Secondary | ICD-10-CM

## 2022-06-11 DIAGNOSIS — F419 Anxiety disorder, unspecified: Secondary | ICD-10-CM | POA: Diagnosis not present

## 2022-06-11 DIAGNOSIS — E669 Obesity, unspecified: Secondary | ICD-10-CM | POA: Insufficient documentation

## 2022-06-11 HISTORY — PX: DRUG INDUCED ENDOSCOPY: SHX6808

## 2022-06-11 SURGERY — DRUG INDUCED SLEEP ENDOSCOPY
Anesthesia: Monitor Anesthesia Care | Site: Nose | Laterality: Bilateral

## 2022-06-11 MED ORDER — LACTATED RINGERS IV SOLN: INTRAVENOUS | Status: DC

## 2022-06-11 MED ORDER — PROMETHAZINE HCL 25 MG/ML IJ SOLN: 6.2500 mg | INTRAMUSCULAR | Status: DC | PRN

## 2022-06-11 MED ORDER — PROPOFOL 500 MG/50ML IV EMUL
INTRAVENOUS | Status: DC | PRN
  Administered 2022-06-11: 45 ug/kg/min via INTRAVENOUS

## 2022-06-11 MED ORDER — OXYMETAZOLINE HCL 0.05 % NA SOLN
NASAL | Status: DC | PRN
  Administered 2022-06-11: 1 via TOPICAL

## 2022-06-11 MED ORDER — MIDAZOLAM HCL 2 MG/2ML IJ SOLN: 0.5000 mg | Freq: Once | INTRAMUSCULAR | Status: DC | PRN

## 2022-06-11 MED ORDER — MUPIROCIN 2 % EX OINT
TOPICAL_OINTMENT | CUTANEOUS | Status: AC
  Filled 2022-06-11: qty 44

## 2022-06-11 MED ORDER — LIDOCAINE HCL (CARDIAC) PF 100 MG/5ML IV SOSY
PREFILLED_SYRINGE | INTRAVENOUS | Status: DC | PRN
  Administered 2022-06-11: 60 mg via INTRAVENOUS

## 2022-06-11 MED ORDER — MEPERIDINE HCL 25 MG/ML IJ SOLN: 6.2500 mg | INTRAMUSCULAR | Status: DC | PRN

## 2022-06-11 MED ORDER — OXYMETAZOLINE HCL 0.05 % NA SOLN
NASAL | Status: AC
  Filled 2022-06-11: qty 180

## 2022-06-11 MED ORDER — OXYCODONE HCL 5 MG PO TABS: 5.0000 mg | ORAL_TABLET | Freq: Once | ORAL | Status: DC | PRN

## 2022-06-11 MED ORDER — OXYCODONE HCL 5 MG/5ML PO SOLN: 5.0000 mg | Freq: Once | ORAL | Status: DC | PRN

## 2022-06-11 MED ORDER — LIDOCAINE-EPINEPHRINE 1 %-1:100000 IJ SOLN
INTRAMUSCULAR | Status: AC
  Filled 2022-06-11: qty 2

## 2022-06-11 MED ORDER — FENTANYL CITRATE (PF) 100 MCG/2ML IJ SOLN: 25.0000 ug | INTRAMUSCULAR | Status: DC | PRN

## 2022-06-11 MED ORDER — ONDANSETRON HCL 4 MG/2ML IJ SOLN
INTRAMUSCULAR | Status: DC | PRN
  Administered 2022-06-11: 4 mg via INTRAVENOUS

## 2022-06-11 SURGICAL SUPPLY — 11 items
CANISTER SUCT 1200ML W/VALVE (MISCELLANEOUS) ×1 IMPLANT
GLOVE BIO SURGEON STRL SZ7.5 (GLOVE) ×1 IMPLANT
KIT CLEAN ENDO (MISCELLANEOUS) ×1 IMPLANT
NDL HYPO 27GX1-1/4 (NEEDLE) IMPLANT
NEEDLE HYPO 27GX1-1/4 (NEEDLE) IMPLANT
PATTIES SURGICAL .5 X3 (DISPOSABLE) ×1 IMPLANT
SHEET MEDIUM DRAPE 40X70 STRL (DRAPES) ×1 IMPLANT
SOL ANTI FOG 6CC (MISCELLANEOUS) ×1 IMPLANT
SYR CONTROL 10ML LL (SYRINGE) IMPLANT
TOWEL GREEN STERILE FF (TOWEL DISPOSABLE) ×1 IMPLANT
TUBE CONNECTING 20X1/4 (TUBING) ×1 IMPLANT

## 2022-06-11 NOTE — Transfer of Care (Signed)
Immediate Anesthesia Transfer of Care Note  Patient: Marc Wiggins  Procedure(s) Performed: DRUG INDUCED ENDOSCOPY (Bilateral: Nose)  Patient Location: PACU  Anesthesia Type:MAC  Level of Consciousness: awake, alert , oriented, and patient cooperative  Airway & Oxygen Therapy: Patient Spontanous Breathing  Post-op Assessment: Report given to RN and Post -op Vital signs reviewed and stable  Post vital signs: Reviewed and stable  Last Vitals:  Vitals Value Taken Time  BP    Temp    Pulse    Resp    SpO2      Last Pain:  Vitals:   06/11/22 0644  TempSrc: Oral  PainSc: 0-No pain      Patients Stated Pain Goal: 3 (47/42/59 5638)  Complications: No notable events documented.

## 2022-06-11 NOTE — Op Note (Signed)
Preop diagnosis: Obstructive sleep apnea Postop diagnosis: same Procedure: Drug-induced sleep endoscopy Surgeon: Redmond Baseman Anesth: IV sedation Compl: None Findings: There is 50% anterior-posterior collapse at the velum making him a candidate for hypoglossal nerve stimulator placement.  There was anterior-posterior and lateral wall collapse at the tongue base. Description:  After discussing risks, benefits, and alternatives, the patient was brought to the operative suite and placed on the operative table in the supine position.  Anesthesia was induced and the patient was given light sedation to simulate natural sleep. When the proper level was reached, an Afrin-soaked pledget was placed in the right nasal passage for a couple of minutes and then removed.  The fiberoptic laryngoscope was then passed to view the pharynx and larynx.  Findings are noted above and the exam was recorded.  After completion, the scope was removed and the patient was returned to anesthesia for wakeup and was moved to the recovery room in stable condition.

## 2022-06-11 NOTE — H&P (Signed)
Marc Wiggins is an 47 y.o. male.   Chief Complaint: Sleep apnea HPI: 47 year old male with obstructive sleep apnea who has been unable to tolerate CPAP.  Past Medical History:  Diagnosis Date   Current moderate episode of major depressive disorder without prior episode ([place]) 06/29/2018   Encounter for annual physical exam 02/06/2022   Generalized anxiety disorder with panic attacks    Recurrent major depressive disorder, in full remission ([name]) 07/05/2021   Severe sleep apnea     Past Surgical History:  Procedure Laterality Date   CHOLECYSTECTOMY     ENDOVENOUS ABLATION SAPHENOUS VEIN W/ LASER Right 04/29/2019   endovenous laser ablation right greater saphenous vein and stab phlebectomy 10-20 incisions right leg by Gae Gallop MD     Family History  Problem Relation Age of Onset   Hypertension Father    Diabetes Maternal Aunt    Stroke Maternal Grandmother    Diabetes Maternal Grandmother    Heart attack Maternal Grandmother    Cancer Paternal Grandmother    Social History:  reports that he has never smoked. He has never used smokeless tobacco. He reports that he does not drink alcohol and does not use drugs.  Allergies:  Allergies  Allergen Reactions   Hydrocodone     Medications Prior to Admission  Medication Sig Dispense Refill   AMBULATORY NON FORMULARY MEDICATION Supply ordered: BiPAP therapy on settings: 22/18 cm H2O. Please include other supplies needed (headgear, cushions, filters, heated tuubing and water chamber) Dx: obstructive sleep apnea 1 Units 99   DULoxetine (CYMBALTA) 20 MG capsule Take 2 capsules (40 mg total) by mouth daily. 180 capsule 3   fluticasone (FLONASE) 50 MCG/ACT nasal spray Administer 2 sprays in each nostril daily. 16 g 5   montelukast (SINGULAIR) 10 MG tablet Take 1 tablet (10 mg total) by mouth at bedtime. 90 tablet 3   Multiple Vitamin (MULTIVITAMIN) tablet Take 1 tablet daily by mouth.     ondansetron (ZOFRAN) 8 MG tablet Take 1  tablet (8 mg total) by mouth every 8 (eight) hours as needed for nausea or vomiting. 30 tablet 2   traZODone (DESYREL) 150 MG tablet Take 1 tablet (150 mg total) by mouth at bedtime as needed for sleep 90 tablet 3   Semaglutide-Weight Management (WEGOVY) 1 MG/0.5ML SOAJ Inject 1 mg into the skin once a week. 6 mL 3    No results found for this or any previous visit (from the past 48 hour(s)). No results found.  Review of Systems  All other systems reviewed and are negative.   Blood pressure 118/79, pulse 78, temperature 98.2 F (36.8 C), temperature source Oral, resp. rate 16, height 6' (1.829 m), weight 109.5 kg, SpO2 99 %. Physical Exam Constitutional:      Appearance: Normal appearance. He is normal weight.  HENT:     Head: Normocephalic and atraumatic.     Right Ear: External ear normal.     Left Ear: External ear normal.     Nose: Nose normal.     Mouth/Throat:     Mouth: Mucous membranes are moist.     Pharynx: Oropharynx is clear.  Eyes:     Extraocular Movements: Extraocular movements intact.     Conjunctiva/sclera: Conjunctivae normal.     Pupils: Pupils are equal, round, and reactive to light.  Cardiovascular:     Rate and Rhythm: Normal rate.  Pulmonary:     Effort: Pulmonary effort is normal.  Musculoskeletal:  Cervical back: Normal range of motion.  Skin:    General: Skin is warm and dry.  Neurological:     General: No focal deficit present.     Mental Status: He is alert and oriented to person, place, and time.  Psychiatric:        Mood and Affect: Mood normal.        Behavior: Behavior normal.        Thought Content: Thought content normal.        Judgment: Judgment normal.      Assessment/Plan Obstructive sleep apnea and BMI 32.74  To OR for sleep endoscopy.  [name], MD 06/11/2022, 7:19 AM

## 2022-06-11 NOTE — Brief Op Note (Signed)
06/11/2022  7:48 AM  PATIENT:  Marc Wiggins  47 y.o. male  PRE-OPERATIVE DIAGNOSIS:  Obstructive sleep apnea BMI 31.0-31.9,adult  POST-OPERATIVE DIAGNOSIS:  Obstructive sleep apnea  PROCEDURE:  Procedure(s): DRUG INDUCED ENDOSCOPY (Bilateral)  SURGEON:  Surgeon(s) and Role:    [name], MD - Primary  PHYSICIAN ASSISTANT:   ASSISTANTS: none   ANESTHESIA:   IV sedation  EBL:  None   BLOOD ADMINISTERED:none  DRAINS: none   LOCAL MEDICATIONS USED:  NONE  SPECIMEN:  No Specimen  DISPOSITION OF SPECIMEN:  N/A  COUNTS:  YES  TOURNIQUET:  * No tourniquets in log *  DICTATION: .Note written in EPIC  PLAN OF CARE: Discharge to home after PACU  PATIENT DISPOSITION:  PACU - hemodynamically stable.   Delay start of Pharmacological VTE agent (>24hrs) due to surgical blood loss or risk of bleeding: no

## 2022-06-11 NOTE — Discharge Instructions (Signed)

## 2022-06-11 NOTE — Anesthesia Procedure Notes (Signed)
Procedure Name: MAC Date/Time: 06/11/2022 7:38 AM  Performed by: Lendy Dittrich, Ernesta Amble, CRNAPre-anesthesia Checklist: Patient identified, Emergency Drugs available, Suction available, Patient being monitored and Timeout performed Patient Re-evaluated:Patient Re-evaluated prior to induction Oxygen Delivery Method: Simple face mask Preoxygenation: Pre-oxygenation with 100% oxygen

## 2022-06-11 NOTE — Anesthesia Postprocedure Evaluation (Signed)
Anesthesia Post Note  Patient: Marc Wiggins  Procedure(s) Performed: DRUG INDUCED ENDOSCOPY (Bilateral: Nose)     Patient location during evaluation: Phase II Anesthesia Type: MAC Level of consciousness: awake and alert, patient cooperative and oriented Pain management: pain level controlled Vital Signs Assessment: post-procedure vital signs reviewed and stable Respiratory status: spontaneous breathing, nonlabored ventilation and respiratory function stable Cardiovascular status: blood pressure returned to baseline and stable Postop Assessment: no apparent nausea or vomiting and able to ambulate Anesthetic complications: no   No notable events documented.  Last Vitals:  Vitals:   06/11/22 0749 06/11/22 0809  BP: 112/84 110/70  Pulse: 74 72  Resp: 20 20  Temp: 36.8 C 36.7 C  SpO2: 99% 98%    Last Pain:  Vitals:   06/11/22 0811  TempSrc:   PainSc: 0-No pain                 Nekayla Heider,E. Celest Reitz

## 2022-06-12 ENCOUNTER — Encounter (HOSPITAL_BASED_OUTPATIENT_CLINIC_OR_DEPARTMENT_OTHER): Payer: Self-pay | Admitting: Otolaryngology

## 2022-06-14 ENCOUNTER — Telehealth: Payer: Self-pay | Admitting: Internal Medicine

## 2022-06-14 NOTE — Telephone Encounter (Signed)
PT just had a procedure completed to see if he would qualify for [employer] . He does qualify and he wonders if he still needs to regularly see Dr. Annamaria Boots. Please call to advise. [phone].

## 2022-06-17 ENCOUNTER — Ambulatory Visit: Payer: No Typology Code available for payment source | Admitting: Internal Medicine

## 2022-06-17 NOTE — Telephone Encounter (Signed)
Dr. Annamaria Boots referred this patient to [facility] to see if he qualifies for the [facility] device.  Once he has been qualified pt gets implanted, either an [employer] rep or [facility] will let us know once the pt has been implanted.  Once the patient has been implanted, we will provide activation for the device & follow up care for the device.

## 2022-06-19 NOTE — Telephone Encounter (Signed)
ATC X1 LVM for patient to give us a call back 

## 2022-06-25 NOTE — Telephone Encounter (Signed)
Mailed patient a letter today in regards to [employer]. Nothing further needed

## 2022-07-25 ENCOUNTER — Other Ambulatory Visit: Payer: Self-pay

## 2022-09-11 ENCOUNTER — Other Ambulatory Visit: Payer: Self-pay

## 2022-09-25 NOTE — Telephone Encounter (Signed)
close

## 2022-10-23 ENCOUNTER — Other Ambulatory Visit (HOSPITAL_COMMUNITY): Payer: Self-pay

## 2022-12-11 ENCOUNTER — Other Ambulatory Visit: Payer: Self-pay

## 2022-12-19 NOTE — Progress Notes (Deleted)
Subjective:   Marc Wiggins 25-Dec-1975 12/24/2022  CC: No chief complaint on file.   HPI: Marc Wiggins is a 47 y.o. male who presents for a routine health maintenance exam.  Labs collected at time of visit.   HEALTH SCREENINGS: - Vision Screening: {Blank single:19197::"pap done","not applicable","up to date","done elsewhere"} - Dental Visits: {Blank single:19197::"pap done","not applicable","up to date","done elsewhere"} - Testicular Exam: {Blank single:19197::"Up to date","Ordered today","Not applicable","Declined","Done elsewhere"} - STD Screening: {Blank single:19197::"Up to date","Ordered today","Not applicable","Declined","Done elsewhere"} - PSA (50+): {Blank single:19197::"Up to date","Ordered today","Not applicable","Declined","Done elsewhere"}  No results found for: "PSA1", "PSA"   - Colonoscopy (45+): {Blank single:19197::"Up to date","Ordered today","Not applicable","Declined","Done elsewhere"}  Discussed with patient purpose of the colonoscopy is to detect colon cancer at curable precancerous or early stages  - AAA Screening: {Blank single:19197::"Up to date","Ordered today","Not applicable","Declined","Done elsewhere"}  Men age 49-75 who have ever smoked - Lung Cancer screening with low-dose CT: {Blank single:19197::"Up to date","Ordered today","Not applicable","Declined","Done elsewhere"}-  Adults age 60-80 who are current cigarette smokers or quit within the last 15 years. Must have 20 pack year history.   Depression and Anxiety Screen done today and results listed below:     07/05/2021    6:37 PM 12/07/2020    2:10 PM 03/23/2020    9:25 AM 02/24/2020   10:24 AM 02/10/2020    8:01 AM  Depression screen PHQ 2/9  Decreased Interest 0 0 0 0 1  Down, Depressed, Hopeless 0 0 0 0 0  PHQ - 2 Score 0 0 0 0 1  Altered sleeping 1  0 1 3  Tired, decreased energy 1  1 0 0  Change in appetite 0  0 0 1  Feeling bad or failure about yourself  0  0 0 0  Trouble  concentrating 1  1 0 1  Moving slowly or fidgety/restless 0  1 0 0  Suicidal thoughts 0  0 0 0  PHQ-9 Score 3  3 1 6   Difficult doing work/chores Not difficult at all  Not difficult at all Somewhat difficult Somewhat difficult      03/23/2020    9:24 AM 02/24/2020   10:25 AM 02/10/2020    8:03 AM 11/17/2019    8:34 AM  GAD 7 : Generalized Anxiety Score  Nervous, Anxious, on Edge 1 2 3 2   Control/stop worrying 1 2 3 2   Worry too much - different things 1 2 3 2   Trouble relaxing 1 2 3 2   Restless 0 1 2 1   Easily annoyed or irritable 1 1 1  0  Afraid - awful might happen 0 1 1 1   Total GAD 7 Score 5 11 16 10   Anxiety Difficulty Not difficult at all Somewhat difficult Somewhat difficult Somewhat difficult    IMMUNIZATIONS:  - Tdap: Tetanus vaccination status reviewed: {tetanus status:315746}. - Influenza: {Blank single:19197::"Up to date","Administered today","Postponed to flu season","Refused","Given elsewhere"} - Pneumovax: {Blank single:19197::"Up to date","Administered today","Not applicable","Refused","Given elsewhere"} - Prevnar: {Blank single:19197::"Up to date","Administered today","Not applicable","Refused","Given elsewhere"} - Zostavax vaccine (50+): {Blank single:19197::"Up to date","Administered today","Not applicable","Refused","Given elsewhere"}   Past medical history, surgical history, medications, allergies, family history and social history reviewed with patient today and changes made to appropriate areas of the chart.   Past Medical History:  Diagnosis Date   Current moderate episode of major depressive disorder without prior episode ([facility]) 06/29/2018   Encounter for annual physical exam 02/06/2022   Generalized anxiety disorder with panic attacks    Recurrent major depressive disorder, in full remission ([facility])  07/05/2021   Severe sleep apnea     Past Surgical History:  Procedure Laterality Date   CHOLECYSTECTOMY     DRUG INDUCED ENDOSCOPY Bilateral 06/11/2022    Procedure: DRUG INDUCED ENDOSCOPY;  Surgeon: Christia Reading, MD;  Location: [facility];  Service: ENT;  Laterality: Bilateral;   ENDOVENOUS ABLATION SAPHENOUS VEIN W/ LASER Right 04/29/2019   endovenous laser ablation right greater saphenous vein and stab phlebectomy 10-20 incisions right leg by Cari Caraway MD     Current Outpatient Medications on File Prior to Visit  Medication Sig   AMBULATORY NON FORMULARY MEDICATION Supply ordered: BiPAP therapy on settings: 22/18 cm H2O. Please include other supplies needed (headgear, cushions, filters, heated tuubing and water chamber) Dx: obstructive sleep apnea   DULoxetine (CYMBALTA) 20 MG capsule Take 2 capsules (40 mg total) by mouth daily.   fluticasone (FLONASE) 50 MCG/ACT nasal spray Administer 2 sprays in each nostril daily.   montelukast (SINGULAIR) 10 MG tablet Take 1 tablet (10 mg total) by mouth at bedtime.   Multiple Vitamin (MULTIVITAMIN) tablet Take 1 tablet daily by mouth.   ondansetron (ZOFRAN) 8 MG tablet Take 1 tablet (8 mg total) by mouth every 8 (eight) hours as needed for nausea or vomiting.   Semaglutide-Weight Management (WEGOVY) 1 MG/0.5ML SOAJ Inject 1 mg into the skin once a week.   traZODone (DESYREL) 150 MG tablet Take 1 tablet (150 mg total) by mouth at bedtime as needed for sleep   No current facility-administered medications on file prior to visit.    Allergies  Allergen Reactions   Hydrocodone      Social History   Socioeconomic History   Marital status: Married    Spouse name: Marchelle Folks   Number of children: Not on file   Years of education: Not on file   Highest education level: Not on file  Occupational History   Not on file  Tobacco Use   Smoking status: Never   Smokeless tobacco: Never  Vaping Use   Vaping status: Never Used  Substance and Sexual Activity   Alcohol use: Never   Drug use: Never   Sexual activity: Yes  Other Topics Concern   Not on file  Social History Narrative    Not on file   Social Determinants of Health   Financial Resource Strain: Not on file  Food Insecurity: Not on file  Transportation Needs: Not on file  Physical Activity: Not on file  Stress: Not on file  Social Connections: Not on file  Intimate Partner Violence: Not on file   Social History   Tobacco Use  Smoking Status Never  Smokeless Tobacco Never   Social History   Substance and Sexual Activity  Alcohol Use Never     Family History  Problem Relation Age of Onset   Hypertension Father    Diabetes Maternal Aunt    Stroke Maternal Grandmother    Diabetes Maternal Grandmother    Heart attack Maternal Grandmother    Cancer Paternal Grandmother      ROS: Denies fever, fatigue, unexplained weight loss/gain, CP, SHOB, and palpatitations. Denies neurological deficits, gastrointestinal and/or genitourinary complaints, and skin changes.   Objective:   There were no vitals filed for this visit.  GENERAL APPEARANCE: Well-appearing, in NAD. Well nourished.  SKIN: Pink, warm and dry. Turgor normal. No rash, lesion, ulceration, or ecchymoses. Hair evenly distributed.  HEENT: HEAD: Normocephalic.  EYES: PERRLA. EOMI. Lids intact w/o defect. Sclera white, Conjunctiva pink w/o exudate.  EARS:  External ear w/o redness, swelling, masses or lesions. EAC clear. TM's intact, translucent w/o bulging, appropriate landmarks visualized. Appropriate acuity to conversational tones.  NOSE: Septum midline w/o deformity. Nares patent, mucosa pink and non-inflamed w/o drainage. No sinus tenderness.  THROAT: Uvula midline. Oropharynx clear. Tonsils non-inflamed w/o exudate. Oral mucosa pink and moist.  NECK: Supple, Trachea midline. Full ROM w/o pain or tenderness. No lymphadenopathy. Thyroid non-tender w/o enlargement or palpable masses.  RESPIRATORY: Chest wall symmetrical w/o masses. Respirations even and non-labored. Breath sounds clear to auscultation bilaterally. No wheezes, rales,  rhonchi, or crackles. CARDIAC: S1, S2 present, regular rate and rhythm. No gallops, murmurs, rubs, or clicks. PMI w/o lifts, heaves, or thrills. No carotid bruits. Capillary refill <2 seconds. Peripheral pulses 2+ bilaterally. GI: Abdomen soft w/o distention. Normoactive bowel sounds. No palpable masses or tenderness. No guarding or rebound tenderness. Liver and spleen w/o tenderness or enlargement. No CVA tenderness.  GU: *** deferred exam. MSK: Muscle tone and strength appropriate for age, w/o atrophy or abnormal movement. EXTREMITIES: Active ROM intact, w/o tenderness, crepitus, or contracture. No obvious joint deformities or effusions. No clubbing, edema, or cyanosis.  NEUROLOGIC: CN's II-XII intact. Motor strength symmetrical with no obvious weakness. No sensory deficits. DTR 2+ symmetric bilaterally. Steady, even gait.  PSYCH/MENTAL STATUS: Alert, oriented x 3. Cooperative, appropriate mood and affect.   Results for orders placed or performed in visit on 02/04/22  CBC With Diff/Platelet  Result Value Ref Range   WBC 7.1 3.4 - 10.8 x10E3/uL   RBC 4.75 4.14 - 5.80 x10E6/uL   Hemoglobin 14.9 13.0 - 17.7 g/dL   Hematocrit 16.1 09.6 - 51.0 %   MCV 94 79 - 97 fL   MCH 31.4 26.6 - 33.0 pg   MCHC 33.4 31.5 - 35.7 g/dL   RDW 04.5 40.9 - 81.1 %   Platelets 251 150 - 450 x10E3/uL   Neutrophils 61 Not Estab. %   Lymphs 28 Not Estab. %   Monocytes 9 Not Estab. %   Eos 1 Not Estab. %   Basos 1 Not Estab. %   Neutrophils Absolute 4.4 1.4 - 7.0 x10E3/uL   Lymphocytes Absolute 2.0 0.7 - 3.1 x10E3/uL   Monocytes Absolute 0.7 0.1 - 0.9 x10E3/uL   EOS (ABSOLUTE) 0.1 0.0 - 0.4 x10E3/uL   Basophils Absolute 0.0 0.0 - 0.2 x10E3/uL   Immature Granulocytes 0 Not Estab. %   Immature Grans (Abs) 0.0 0.0 - 0.1 x10E3/uL  Comprehensive metabolic panel  Result Value Ref Range   Glucose 91 70 - 99 mg/dL   BUN 16 6 - 24 mg/dL   Creatinine, Ser 9.14 0.76 - 1.27 mg/dL   eGFR 75 >78 GN/FAO/1.30    BUN/Creatinine Ratio 13 9 - 20   Sodium 142 134 - 144 mmol/L   Potassium 4.7 3.5 - 5.2 mmol/L   Chloride 103 96 - 106 mmol/L   CO2 27 20 - 29 mmol/L   Calcium 9.4 8.7 - 10.2 mg/dL   Total Protein 6.6 6.0 - 8.5 g/dL   Albumin 4.5 4.1 - 5.1 g/dL   Globulin, Total 2.1 1.5 - 4.5 g/dL   Albumin/Globulin Ratio 2.1 1.2 - 2.2   Bilirubin Total 0.3 0.0 - 1.2 mg/dL   Alkaline Phosphatase 96 44 - 121 IU/L   AST 20 0 - 40 IU/L   ALT 18 0 - 44 IU/L  Hemoglobin A1c  Result Value Ref Range   Hgb A1c MFr Bld 5.1 4.8 - 5.6 %   Est.  average glucose Bld gHb Est-mCnc 100 mg/dL  VITAMIN D 25 Hydroxy (Vit-D Deficiency, Fractures)  Result Value Ref Range   Vit D, 25-Hydroxy 38.8 30.0 - 100.0 ng/mL  Lipid panel  Result Value Ref Range   Cholesterol, Total 176 100 - 199 mg/dL   Triglycerides 75 0 - 149 mg/dL   HDL 79 >16 mg/dL   VLDL Cholesterol Cal 14 5 - 40 mg/dL   LDL Chol Calc (NIH) 83 0 - 99 mg/dL   Chol/HDL Ratio 2.2 0.0 - 5.0 ratio    Assessment & Plan:  ***   No orders of the defined types were placed in this encounter.   PATIENT COUNSELING: - Encouraged to adjust caloric intake to maintain or achieve ideal body weight, to reduce intake of dietary saturated fat and total fat, to limit sodium intake by avoiding high sodium foods and not adding table salt, and to maintain adequate dietary potassium and calcium preferably from fresh fruits, vegetables, and low-fat dairy products.   - Advised to avoid cigarette smoking. - Discussed with the patient that most people either abstain from alcohol or drink within safe limits (<=14/week and <=4 drinks/occasion for males, <=7/weeks and <= 3 drinks/occasion for females) and that the risk for alcohol disorders and other health effects rises proportionally with the number of drinks per week and how often a drinker exceeds daily limits. - Discussed cessation/primary prevention of drug use and availability of treatment for abuse.   - Stressed the  importance of regular exercise - Injury prevention: Discussed safety belts, safety helmets, smoke detector, smoking near bedding or upholstery.  - Dental health: Discussed importance of regular tooth brushing, flossing, and dental visits.  - Sexuality: Discussed sexually transmitted diseases, partner selection, use of condoms, avoidance of unintended pregnancy  and contraceptive alternatives.   NEXT PREVENTATIVE PHYSICAL DUE IN 1 YEAR.  No follow-ups on file.  Patient to reach out to office if new, worrisome, or unresolved symptoms arise or if no improvement in patient's condition. Patient verbalized understanding and is agreeable to treatment plan. All questions answered to patient's satisfaction.    Yolanda Manges, FNP

## 2022-12-24 ENCOUNTER — Encounter (HOSPITAL_BASED_OUTPATIENT_CLINIC_OR_DEPARTMENT_OTHER): Payer: No Typology Code available for payment source | Admitting: Family Medicine

## 2023-01-02 NOTE — Progress Notes (Signed)
Subjective:   Marc Wiggins [birth year] 01/03/2023  CC: Chief Complaint  Patient presents with   Annual Exam    Pt is here today for his physical. States he has been fatigued a lot recently which he wants to discuss.    HPI: Marc Wiggins is a 47 y.o. male who presents for concern of new issue. Patient is not due to AE until after 02/04/22.    FATIGUE: States he has felt very tired and "feeling drained" all the time. Believed it to be the heat of summer and has tried increasing oral intake with electrolyte drinks. He is not using his CPAP currently.  Has been off from CPAP x 3 months.  He has been approved for INSPIRE device. He is getting that procedure scheduled. Denies issues with libido, ED, or voiding. Denies excessive weight loss or gain.  Onset: 1-2 months   Fevers: no Generalized weakness:  yes Chest pain: no Shortness of breath: no Dyspnea on exertion: no Orthopnea: no Lower extremity edema: no Arthralgias:no Bloody or dark stools: no Depression: no Recent stress/anxiety: no Observed apnea by bed partner: yes Daytime hypersomnolence:no   Past medical history, surgical history, medications, allergies, family history and social history reviewed with patient today and changes made to appropriate areas of the chart.   Past Medical History:  Diagnosis Date   Current moderate episode of major depressive disorder without prior episode (HCC) 06/29/2018   Encounter for annual physical exam 02/06/2022   Generalized anxiety disorder with panic attacks    Recurrent major depressive disorder, in full remission (HCC) 07/05/2021   Severe sleep apnea     Past Surgical History:  Procedure Laterality Date   CHOLECYSTECTOMY     DRUG INDUCED ENDOSCOPY Bilateral 06/11/2022   Procedure: DRUG INDUCED ENDOSCOPY;  Surgeon: Christia Reading, MD;  Location: [facility];  Service: ENT;  Laterality: Bilateral;   ENDOVENOUS ABLATION SAPHENOUS VEIN W/ LASER Right  04/29/2019   endovenous laser ablation right greater saphenous vein and stab phlebectomy 10-20 incisions right leg by Cari Caraway MD     Current Outpatient Medications on File Prior to Visit  Medication Sig   AMBULATORY NON FORMULARY MEDICATION Supply ordered: BiPAP therapy on settings: 22/18 cm H2O. Please include other supplies needed (headgear, cushions, filters, heated tuubing and water chamber) Dx: obstructive sleep apnea   DULoxetine (CYMBALTA) 20 MG capsule Take 2 capsules (40 mg total) by mouth daily.   fluticasone (FLONASE) 50 MCG/ACT nasal spray Administer 2 sprays in each nostril daily.   montelukast (SINGULAIR) 10 MG tablet Take 1 tablet (10 mg total) by mouth at bedtime.   Multiple Vitamin (MULTIVITAMIN) tablet Take 1 tablet daily by mouth.   traZODone (DESYREL) 150 MG tablet Take 1 tablet (150 mg total) by mouth at bedtime as needed for sleep   ondansetron (ZOFRAN) 8 MG tablet Take 1 tablet (8 mg total) by mouth every 8 (eight) hours as needed for nausea or vomiting. (Patient not taking: Reported on 01/03/2023)   No current facility-administered medications on file prior to visit.    Allergies  Allergen Reactions   Hydrocodone      Social History   Socioeconomic History   Marital status: Married    Spouse name: Marchelle Folks   Number of children: Not on file   Years of education: Not on file   Highest education level: Not on file  Occupational History   Not on file  Tobacco Use   Smoking status: Never   Smokeless tobacco: Never  Vaping Use   Vaping status: Never Used  Substance and Sexual Activity   Alcohol use: Never   Drug use: Never   Sexual activity: Yes  Other Topics Concern   Not on file  Social History Narrative   Not on file   Social Determinants of Health   Financial Resource Strain: Not on file  Food Insecurity: Not on file  Transportation Needs: Not on file  Physical Activity: Not on file  Stress: Not on file  Social Connections: Not on file   Intimate Partner Violence: Not on file   Social History   Tobacco Use  Smoking Status Never  Smokeless Tobacco Never   Social History   Substance and Sexual Activity  Alcohol Use Never     Family History  Problem Relation Age of Onset   Hypertension Father    Diabetes Maternal Aunt    Stroke Maternal Grandmother    Diabetes Maternal Grandmother    Heart attack Maternal Grandmother    Cancer Paternal Grandmother      ROS: Denies fever, fatigue, unexplained weight loss/gain, CP, SHOB, and palpatitations. Denies neurological deficits, gastrointestinal and/or genitourinary complaints, and skin changes.   Objective:   Today's Vitals   01/03/23 0857  BP: 126/84  Pulse: 68  SpO2: 99%  Weight: 225 lb 3.2 oz (102.2 kg)  Height: 6' (1.829 m)    GENERAL APPEARANCE: Well-appearing, in NAD. Well nourished.  SKIN: Pink, warm and dry. Turgor normal. No rash, lesion, ulceration, or ecchymoses. Hair evenly distributed.  HEENT: HEAD: Normocephalic.  EYES: PERRLA. EOMI. Lids intact w/o defect. Sclera white, Conjunctiva pink w/o exudate.  EARS: External ear w/o redness, swelling, masses or lesions. EAC clear. TM's intact, translucent w/o bulging, appropriate landmarks visualized. Appropriate acuity to conversational tones.  NOSE: Septum midline w/o deformity. Nares patent, mucosa pink and non-inflamed w/o drainage. No sinus tenderness.  THROAT: Uvula midline. Oropharynx clear. Tonsils non-inflamed w/o exudate. Oral mucosa pink and moist.  NECK: Supple, Trachea midline. Full ROM w/o pain or tenderness. No lymphadenopathy. Thyroid non-tender w/o enlargement or palpable masses.  RESPIRATORY: Chest wall symmetrical w/o masses. Respirations even and non-labored. Breath sounds clear to auscultation bilaterally. No wheezes, rales, rhonchi, or crackles. CARDIAC: S1, S2 present, regular rate and rhythm. No gallops, murmurs, rubs, or clicks. PMI w/o lifts, heaves, or thrills. No carotid bruits.  Capillary refill <2 seconds. Peripheral pulses 2+ bilaterally. GI: Abdomen soft w/o distention. Normoactive bowel sounds. No palpable masses or tenderness. No guarding or rebound tenderness. Liver and spleen w/o tenderness or enlargement. No CVA tenderness.  MSK: Muscle tone and strength appropriate for age, w/o atrophy or abnormal movement. EXTREMITIES: Active ROM intact, w/o tenderness, crepitus, or contracture. No obvious joint deformities or effusions. No clubbing, edema, or cyanosis.  NEUROLOGIC: CN's II-XII intact. Motor strength symmetrical with no obvious weakness. No sensory deficits. DTR 2+ symmetric bilaterally. Steady, even gait.  PSYCH/MENTAL STATUS: Alert, oriented x 3. Cooperative, appropriate mood and affect.     Assessment & Plan:  1. Chronic fatigue Will obtain fasting labs to evaluate for iron deficiency, blood loss, electrolyte or vitamin deficiency and check testosterone values as well. This may be related to patient's OSA as he has been without CPAP. No concerning symptoms or arrhythmia, chest pain, SHOB, fever/night sweats or weight changes. Encouraged to schedule for INSPIRE procedure. Will follow up in 4 weeks for AE as well with fasting labs.   - CBC with Differential/Platelet; Future - Comprehensive metabolic panel; Future - Hemoglobin A1c; Future -  Iron, TIBC and Ferritin Panel; Future - Lipid panel; Future - Testosterone,Free and Total; Future - TSH; Future - Vitamin B12; Future - VITAMIN D 25 Hydroxy (Vit-D Deficiency, Fractures); Future  2. Screening for colon cancer Referral placed to GI for routine cscope.  - Ambulatory referral to Gastroenterology   Orders Placed This Encounter  Procedures   CBC with Differential/Platelet    Standing Status:   Future    Standing Expiration Date:   01/03/2024   Comprehensive metabolic panel    Standing Status:   Future    Standing Expiration Date:   01/03/2024   Hemoglobin A1c    Standing Status:   Future     Standing Expiration Date:   01/03/2024   Iron, TIBC and Ferritin Panel    Standing Status:   Future    Standing Expiration Date:   01/03/2024   Lipid panel    Standing Status:   Future    Standing Expiration Date:   01/03/2024   Testosterone,Free and Total    Standing Status:   Future    Standing Expiration Date:   01/03/2024   TSH    Standing Status:   Future    Standing Expiration Date:   01/03/2024   Vitamin B12    Standing Status:   Future    Standing Expiration Date:   01/03/2024   VITAMIN D 25 Hydroxy (Vit-D Deficiency, Fractures)    Standing Status:   Future    Standing Expiration Date:   01/03/2024   Ambulatory referral to Gastroenterology    Referral Priority:   Routine    Referral Type:   Consultation    Referral Reason:   Specialty Services Required    Number of Visits Requested:   1   Return in about 5 weeks (around 02/07/2023) for ANNUAL PHYSICAL.  Patient to reach out to office if new, worrisome, or unresolved symptoms arise or if no improvement in patient's condition. Patient verbalized understanding and is agreeable to treatment plan. All questions answered to patient's satisfaction.    Yolanda Manges, FNP

## 2023-01-03 ENCOUNTER — Ambulatory Visit (INDEPENDENT_AMBULATORY_CARE_PROVIDER_SITE_OTHER): Payer: 59 | Admitting: Family Medicine

## 2023-01-03 ENCOUNTER — Encounter (HOSPITAL_BASED_OUTPATIENT_CLINIC_OR_DEPARTMENT_OTHER): Payer: Self-pay | Admitting: Family Medicine

## 2023-01-03 VITALS — BP 126/84 | HR 68 | Ht 72.0 in | Wt 225.2 lb

## 2023-01-03 DIAGNOSIS — Z1211 Encounter for screening for malignant neoplasm of colon: Secondary | ICD-10-CM | POA: Diagnosis not present

## 2023-01-03 DIAGNOSIS — R5382 Chronic fatigue, unspecified: Secondary | ICD-10-CM | POA: Diagnosis not present

## 2023-01-24 ENCOUNTER — Other Ambulatory Visit: Payer: Self-pay

## 2023-01-24 ENCOUNTER — Other Ambulatory Visit: Payer: Self-pay | Admitting: Nurse Practitioner

## 2023-01-27 ENCOUNTER — Other Ambulatory Visit (HOSPITAL_COMMUNITY): Payer: Self-pay

## 2023-01-27 ENCOUNTER — Other Ambulatory Visit: Payer: Self-pay

## 2023-01-27 MED ORDER — DULOXETINE HCL 20 MG PO CPEP
40.0000 mg | ORAL_CAPSULE | Freq: Every day | ORAL | 1 refills | Status: DC
  Filled 2023-01-27: qty 60, 30d supply, fill #0

## 2023-02-05 NOTE — Progress Notes (Deleted)
Subjective:   Marc Wiggins 10/14/1975 02/07/2023  CC: No chief complaint on file.   HPI: Marc Wiggins is a 47 y.o. male who presents for a routine health maintenance exam.  Labs collected at time of visit.   HEALTH SCREENINGS: - Vision Screening: {Blank single:19197::"pap done","not applicable","up to date","done elsewhere"} - Dental Visits: {Blank single:19197::"pap done","not applicable","up to date","done elsewhere"} - Testicular Exam: {Blank single:19197::"Up to date","Ordered today","Not applicable","Declined","Done elsewhere"} - STD Screening: {Blank single:19197::"Up to date","Ordered today","Not applicable","Declined","Done elsewhere"} - PSA (50+): {Blank single:19197::"Up to date","Ordered today","Not applicable","Declined","Done elsewhere"}  No results found for: "PSA1", "PSA"   - Colonoscopy (45+): {Blank single:19197::"Up to date","Ordered today","Not applicable","Declined","Done elsewhere"}  Discussed with patient purpose of the colonoscopy is to detect colon cancer at curable precancerous or early stages  - AAA Screening: {Blank single:19197::"Up to date","Ordered today","Not applicable","Declined","Done elsewhere"}  Men age 40-75 who have ever smoked - Lung Cancer screening with low-dose CT: {Blank single:19197::"Up to date","Ordered today","Not applicable","Declined","Done elsewhere"}-  Adults age 74-80 who are current cigarette smokers or quit within the last 15 years. Must have 20 pack year history.   Depression and Anxiety Screen done today and results listed below:     01/03/2023    8:59 AM 07/05/2021    6:37 PM 12/07/2020    2:10 PM 03/23/2020    9:25 AM 02/24/2020   10:24 AM  Depression screen PHQ 2/9  Decreased Interest 1 0 0 0 0  Down, Depressed, Hopeless 1 0 0 0 0  PHQ - 2 Score 2 0 0 0 0  Altered sleeping 1 1  0 1  Tired, decreased energy 2 1  1  0  Change in appetite 0 0  0 0  Feeling bad or failure about yourself  1 0  0 0  Trouble  concentrating 0 1  1 0  Moving slowly or fidgety/restless 0 0  1 0  Suicidal thoughts 0 0  0 0  PHQ-9 Score 6 3  3 1   Difficult doing work/chores Somewhat difficult Not difficult at all  Not difficult at all Somewhat difficult      01/03/2023    8:59 AM 03/23/2020    9:24 AM 02/24/2020   10:25 AM 02/10/2020    8:03 AM  GAD 7 : Generalized Anxiety Score  Nervous, Anxious, on Edge 1 1 2 3   Control/stop worrying 1 1 2 3   Worry too much - different things 1 1 2 3   Trouble relaxing 1 1 2 3   Restless 0 0 1 2  Easily annoyed or irritable 1 1 1 1   Afraid - awful might happen 0 0 1 1  Total GAD 7 Score 5 5 11 16   Anxiety Difficulty Somewhat difficult Not difficult at all Somewhat difficult Somewhat difficult    IMMUNIZATIONS:  - Tdap: Tetanus vaccination status reviewed: {tetanus status:315746}. - Influenza: {Blank single:19197::"Up to date","Administered today","Postponed to flu season","Refused","Given elsewhere"} - Pneumovax: {Blank single:19197::"Up to date","Administered today","Not applicable","Refused","Given elsewhere"} - Prevnar: {Blank single:19197::"Up to date","Administered today","Not applicable","Refused","Given elsewhere"} - Zostavax vaccine (50+): {Blank single:19197::"Up to date","Administered today","Not applicable","Refused","Given elsewhere"}   Past medical history, surgical history, medications, allergies, family history and social history reviewed with patient today and changes made to appropriate areas of the chart.   Past Medical History:  Diagnosis Date   Current moderate episode of major depressive disorder without prior episode (HCC) 06/29/2018   Encounter for annual physical exam 02/06/2022   Generalized anxiety disorder with panic attacks    Recurrent major depressive disorder, in full remission (HCC)  07/05/2021   Severe sleep apnea     Past Surgical History:  Procedure Laterality Date   CHOLECYSTECTOMY     DRUG INDUCED ENDOSCOPY Bilateral 06/11/2022    Procedure: DRUG INDUCED ENDOSCOPY;  Surgeon: Christia Reading, MD;  Location: [facility];  Service: ENT;  Laterality: Bilateral;   ENDOVENOUS ABLATION SAPHENOUS VEIN W/ LASER Right 04/29/2019   endovenous laser ablation right greater saphenous vein and stab phlebectomy 10-20 incisions right leg by Cari Caraway MD     Current Outpatient Medications on File Prior to Visit  Medication Sig   AMBULATORY NON FORMULARY MEDICATION Supply ordered: BiPAP therapy on settings: 22/18 cm H2O. Please include other supplies needed (headgear, cushions, filters, heated tuubing and water chamber) Dx: obstructive sleep apnea   DULoxetine (CYMBALTA) 20 MG capsule Take 2 capsules (40 mg total) by mouth daily.   fluticasone (FLONASE) 50 MCG/ACT nasal spray Administer 2 sprays in each nostril daily.   montelukast (SINGULAIR) 10 MG tablet Take 1 tablet (10 mg total) by mouth at bedtime.   Multiple Vitamin (MULTIVITAMIN) tablet Take 1 tablet daily by mouth.   ondansetron (ZOFRAN) 8 MG tablet Take 1 tablet (8 mg total) by mouth every 8 (eight) hours as needed for nausea or vomiting. (Patient not taking: Reported on 01/03/2023)   traZODone (DESYREL) 150 MG tablet Take 1 tablet (150 mg total) by mouth at bedtime as needed for sleep   No current facility-administered medications on file prior to visit.    Allergies  Allergen Reactions   Hydrocodone      Social History   Socioeconomic History   Marital status: Married    Spouse name: Marchelle Folks   Number of children: Not on file   Years of education: Not on file   Highest education level: Not on file  Occupational History   Not on file  Tobacco Use   Smoking status: Never   Smokeless tobacco: Never  Vaping Use   Vaping status: Never Used  Substance and Sexual Activity   Alcohol use: Never   Drug use: Never   Sexual activity: Yes  Other Topics Concern   Not on file  Social History Narrative   Not on file   Social Determinants of Health    Financial Resource Strain: Not on file  Food Insecurity: Not on file  Transportation Needs: Not on file  Physical Activity: Not on file  Stress: Not on file  Social Connections: Not on file  Intimate Partner Violence: Not on file   Social History   Tobacco Use  Smoking Status Never  Smokeless Tobacco Never   Social History   Substance and Sexual Activity  Alcohol Use Never     Family History  Problem Relation Age of Onset   Hypertension Father    Diabetes Maternal Aunt    Stroke Maternal Grandmother    Diabetes Maternal Grandmother    Heart attack Maternal Grandmother    Cancer Paternal Grandmother      ROS: Denies fever, fatigue, unexplained weight loss/gain, CP, SHOB, and palpatitations. Denies neurological deficits, gastrointestinal and/or genitourinary complaints, and skin changes.   Objective:   There were no vitals filed for this visit.  GENERAL APPEARANCE: Well-appearing, in NAD. Well nourished.  SKIN: Pink, warm and dry. Turgor normal. No rash, lesion, ulceration, or ecchymoses. Hair evenly distributed.  HEENT: HEAD: Normocephalic.  EYES: PERRLA. EOMI. Lids intact w/o defect. Sclera white, Conjunctiva pink w/o exudate.  EARS: External ear w/o redness, swelling, masses or lesions. EAC clear. TM's  intact, translucent w/o bulging, appropriate landmarks visualized. Appropriate acuity to conversational tones.  NOSE: Septum midline w/o deformity. Nares patent, mucosa pink and non-inflamed w/o drainage. No sinus tenderness.  THROAT: Uvula midline. Oropharynx clear. Tonsils non-inflamed w/o exudate. Oral mucosa pink and moist.  NECK: Supple, Trachea midline. Full ROM w/o pain or tenderness. No lymphadenopathy. Thyroid non-tender w/o enlargement or palpable masses.  RESPIRATORY: Chest wall symmetrical w/o masses. Respirations even and non-labored. Breath sounds clear to auscultation bilaterally. No wheezes, rales, rhonchi, or crackles. CARDIAC: S1, S2 present,  regular rate and rhythm. No gallops, murmurs, rubs, or clicks. PMI w/o lifts, heaves, or thrills. No carotid bruits. Capillary refill <2 seconds. Peripheral pulses 2+ bilaterally. GI: Abdomen soft w/o distention. Normoactive bowel sounds. No palpable masses or tenderness. No guarding or rebound tenderness. Liver and spleen w/o tenderness or enlargement. No CVA tenderness.  GU: *** deferred exam. MSK: Muscle tone and strength appropriate for age, w/o atrophy or abnormal movement. EXTREMITIES: Active ROM intact, w/o tenderness, crepitus, or contracture. No obvious joint deformities or effusions. No clubbing, edema, or cyanosis.  NEUROLOGIC: CN's II-XII intact. Motor strength symmetrical with no obvious weakness. No sensory deficits. DTR 2+ symmetric bilaterally. Steady, even gait.  PSYCH/MENTAL STATUS: Alert, oriented x 3. Cooperative, appropriate mood and affect.   Results for orders placed or performed in visit on 02/04/22  CBC With Diff/Platelet  Result Value Ref Range   WBC 7.1 3.4 - 10.8 x10E3/uL   RBC 4.75 4.14 - 5.80 x10E6/uL   Hemoglobin 14.9 13.0 - 17.7 g/dL   Hematocrit 16.1 09.6 - 51.0 %   MCV 94 79 - 97 fL   MCH 31.4 26.6 - 33.0 pg   MCHC 33.4 31.5 - 35.7 g/dL   RDW 04.5 40.9 - 81.1 %   Platelets 251 150 - 450 x10E3/uL   Neutrophils 61 Not Estab. %   Lymphs 28 Not Estab. %   Monocytes 9 Not Estab. %   Eos 1 Not Estab. %   Basos 1 Not Estab. %   Neutrophils Absolute 4.4 1.4 - 7.0 x10E3/uL   Lymphocytes Absolute 2.0 0.7 - 3.1 x10E3/uL   Monocytes Absolute 0.7 0.1 - 0.9 x10E3/uL   EOS (ABSOLUTE) 0.1 0.0 - 0.4 x10E3/uL   Basophils Absolute 0.0 0.0 - 0.2 x10E3/uL   Immature Granulocytes 0 Not Estab. %   Immature Grans (Abs) 0.0 0.0 - 0.1 x10E3/uL  Comprehensive metabolic panel  Result Value Ref Range   Glucose 91 70 - 99 mg/dL   BUN 16 6 - 24 mg/dL   Creatinine, Ser 9.14 0.76 - 1.27 mg/dL   eGFR 75 >78 GN/FAO/1.30   BUN/Creatinine Ratio 13 9 - 20   Sodium 142 134 - 144  mmol/L   Potassium 4.7 3.5 - 5.2 mmol/L   Chloride 103 96 - 106 mmol/L   CO2 27 20 - 29 mmol/L   Calcium 9.4 8.7 - 10.2 mg/dL   Total Protein 6.6 6.0 - 8.5 g/dL   Albumin 4.5 4.1 - 5.1 g/dL   Globulin, Total 2.1 1.5 - 4.5 g/dL   Albumin/Globulin Ratio 2.1 1.2 - 2.2   Bilirubin Total 0.3 0.0 - 1.2 mg/dL   Alkaline Phosphatase 96 44 - 121 IU/L   AST 20 0 - 40 IU/L   ALT 18 0 - 44 IU/L  Hemoglobin A1c  Result Value Ref Range   Hgb A1c MFr Bld 5.1 4.8 - 5.6 %   Est. average glucose Bld gHb Est-mCnc 100 mg/dL  VITAMIN D 25  Hydroxy (Vit-D Deficiency, Fractures)  Result Value Ref Range   Vit D, 25-Hydroxy 38.8 30.0 - 100.0 ng/mL  Lipid panel  Result Value Ref Range   Cholesterol, Total 176 100 - 199 mg/dL   Triglycerides 75 0 - 149 mg/dL   HDL 79 >25 mg/dL   VLDL Cholesterol Cal 14 5 - 40 mg/dL   LDL Chol Calc (NIH) 83 0 - 99 mg/dL   Chol/HDL Ratio 2.2 0.0 - 5.0 ratio    Assessment & Plan:  ***   No orders of the defined types were placed in this encounter.   PATIENT COUNSELING: - Encouraged to adjust caloric intake to maintain or achieve ideal body weight, to reduce intake of dietary saturated fat and total fat, to limit sodium intake by avoiding high sodium foods and not adding table salt, and to maintain adequate dietary potassium and calcium preferably from fresh fruits, vegetables, and low-fat dairy products.   - Advised to avoid cigarette smoking. - Discussed with the patient that most people either abstain from alcohol or drink within safe limits (<=14/week and <=4 drinks/occasion for males, <=7/weeks and <= 3 drinks/occasion for females) and that the risk for alcohol disorders and other health effects rises proportionally with the number of drinks per week and how often a drinker exceeds daily limits. - Discussed cessation/primary prevention of drug use and availability of treatment for abuse.   - Stressed the importance of regular exercise - Injury prevention: Discussed  safety belts, safety helmets, smoke detector, smoking near bedding or upholstery.  - Dental health: Discussed importance of regular tooth brushing, flossing, and dental visits.  - Sexuality: Discussed sexually transmitted diseases, partner selection, use of condoms, avoidance of unintended pregnancy  and contraceptive alternatives.   NEXT PREVENTATIVE PHYSICAL DUE IN 1 YEAR.  No follow-ups on file.  Patient to reach out to office if new, worrisome, or unresolved symptoms arise or if no improvement in patient's condition. Patient verbalized understanding and is agreeable to treatment plan. All questions answered to patient's satisfaction.    Yolanda Manges, FNP

## 2023-02-06 ENCOUNTER — Encounter (HOSPITAL_BASED_OUTPATIENT_CLINIC_OR_DEPARTMENT_OTHER): Payer: No Typology Code available for payment source | Admitting: Nurse Practitioner

## 2023-02-06 ENCOUNTER — Encounter (HOSPITAL_BASED_OUTPATIENT_CLINIC_OR_DEPARTMENT_OTHER): Payer: No Typology Code available for payment source | Admitting: Family Medicine

## 2023-02-07 ENCOUNTER — Encounter (HOSPITAL_BASED_OUTPATIENT_CLINIC_OR_DEPARTMENT_OTHER): Payer: 59 | Admitting: Family Medicine

## 2023-02-14 ENCOUNTER — Ambulatory Visit (INDEPENDENT_AMBULATORY_CARE_PROVIDER_SITE_OTHER): Payer: 59 | Admitting: Family Medicine

## 2023-02-14 ENCOUNTER — Other Ambulatory Visit (HOSPITAL_COMMUNITY): Payer: Self-pay

## 2023-02-14 VITALS — BP 131/86 | HR 65 | Ht 72.0 in | Wt 227.9 lb

## 2023-02-14 DIAGNOSIS — F331 Major depressive disorder, recurrent, moderate: Secondary | ICD-10-CM

## 2023-02-14 DIAGNOSIS — R5382 Chronic fatigue, unspecified: Secondary | ICD-10-CM

## 2023-02-14 DIAGNOSIS — F411 Generalized anxiety disorder: Secondary | ICD-10-CM

## 2023-02-14 DIAGNOSIS — Z0000 Encounter for general adult medical examination without abnormal findings: Secondary | ICD-10-CM | POA: Diagnosis not present

## 2023-02-14 DIAGNOSIS — F410 Panic disorder [episodic paroxysmal anxiety] without agoraphobia: Secondary | ICD-10-CM

## 2023-02-14 MED ORDER — MONTELUKAST SODIUM 10 MG PO TABS
10.0000 mg | ORAL_TABLET | Freq: Every day | ORAL | 3 refills | Status: DC
  Filled 2023-02-14 – 2023-06-05 (×2): qty 90, 90d supply, fill #0
  Filled 2023-09-22: qty 90, 90d supply, fill #1

## 2023-02-14 MED ORDER — DULOXETINE HCL 20 MG PO CPEP
40.0000 mg | ORAL_CAPSULE | Freq: Every day | ORAL | 3 refills | Status: DC
  Filled 2023-02-14 – 2023-02-26 (×2): qty 180, 90d supply, fill #0
  Filled 2023-06-05: qty 180, 90d supply, fill #1
  Filled 2023-09-09: qty 180, 90d supply, fill #2
  Filled 2023-12-09: qty 180, 90d supply, fill #3

## 2023-02-14 MED ORDER — TRAZODONE HCL 150 MG PO TABS
150.0000 mg | ORAL_TABLET | Freq: Every evening | ORAL | 3 refills | Status: DC | PRN
  Filled 2023-02-14 – 2023-03-20 (×2): qty 90, 90d supply, fill #0
  Filled 2023-06-23: qty 90, 90d supply, fill #1
  Filled 2023-09-22: qty 90, 90d supply, fill #2
  Filled 2023-12-24: qty 90, 90d supply, fill #3

## 2023-02-14 NOTE — Progress Notes (Signed)
Subjective:   Marc Wiggins [birth year] 02/14/2023  CC: Chief Complaint  Patient presents with   Annual Exam    Pt is here today for his physical. Still has complaints of fatigue that he wants to discuss.    HPI: Marc Wiggins is a 47 y.o. male who presents for a routine health maintenance exam.  Labs collected at time of visit.   HEALTH SCREENINGS: - Vision Screening: not applicable - Dental Visits: up to date - Testicular Exam: Declined - STD Screening: Declined - PSA (50+): Not applicable  No results found for: "PSA1", "PSA"   - Colonoscopy (45+):  Ordered, pt will call to schedule   Discussed with patient purpose of the colonoscopy is to detect colon cancer at curable precancerous or early stages  - AAA Screening: Not applicable  Men age 51-75 who have ever smoked - Lung Cancer screening with low-dose CT: Not applicable-  Adults age 23-80 who are current cigarette smokers or quit within the last 15 years. Must have 20 pack year history.   Depression and Anxiety Screen done today and results listed below:     02/14/2023   10:17 AM 01/03/2023    8:59 AM 07/05/2021    6:37 PM 12/07/2020    2:10 PM 03/23/2020    9:25 AM  Depression screen PHQ 2/9  Decreased Interest 1 1 0 0 0  Down, Depressed, Hopeless 1 1 0 0 0  PHQ - 2 Score 2 2 0 0 0  Altered sleeping 2 1 1   0  Tired, decreased energy 3 2 1  1   Change in appetite 1 0 0  0  Feeling bad or failure about yourself  1 1 0  0  Trouble concentrating 1 0 1  1  Moving slowly or fidgety/restless 0 0 0  1  Suicidal thoughts 0 0 0  0  PHQ-9 Score 10 6 3  3   Difficult doing work/chores Somewhat difficult Somewhat difficult Not difficult at all  Not difficult at all      02/14/2023   10:17 AM 01/03/2023    8:59 AM 03/23/2020    9:24 AM 02/24/2020   10:25 AM  GAD 7 : Generalized Anxiety Score  Nervous, Anxious, on Edge 2 1 1 2   Control/stop worrying 2 1 1 2   Worry too much - different things 2 1 1 2   Trouble  relaxing 1 1 1 2   Restless 0 0 0 1  Easily annoyed or irritable 1 1 1 1   Afraid - awful might happen 0 0 0 1  Total GAD 7 Score 8 5 5 11   Anxiety Difficulty Somewhat difficult Somewhat difficult Not difficult at all Somewhat difficult    IMMUNIZATIONS:  - Tdap: Tetanus vaccination status reviewed: last tetanus booster within 10 years. - Influenza: Postponed to flu season - Pneumovax: Not applicable - Prevnar: Not applicable - Zostavax vaccine (50+): Not applicable   Past medical history, surgical history, medications, allergies, family history and social history reviewed with patient today and changes made to appropriate areas of the chart.   Past Medical History:  Diagnosis Date   Current moderate episode of major depressive disorder without prior episode (HCC) 06/29/2018   Encounter for annual physical exam 02/06/2022   Generalized anxiety disorder with panic attacks    Recurrent major depressive disorder, in full remission (HCC) 07/05/2021   Severe sleep apnea     Past Surgical History:  Procedure Laterality Date   CHOLECYSTECTOMY     DRUG INDUCED  ENDOSCOPY Bilateral 06/11/2022   Procedure: DRUG INDUCED ENDOSCOPY;  Surgeon: Christia Reading, MD;  Location: [facility];  Service: ENT;  Laterality: Bilateral;   ENDOVENOUS ABLATION SAPHENOUS VEIN W/ LASER Right 04/29/2019   endovenous laser ablation right greater saphenous vein and stab phlebectomy 10-20 incisions right leg by Cari Caraway MD     Current Outpatient Medications on File Prior to Visit  Medication Sig   AMBULATORY NON FORMULARY MEDICATION Supply ordered: BiPAP therapy on settings: 22/18 cm H2O. Please include other supplies needed (headgear, cushions, filters, heated tuubing and water chamber) Dx: obstructive sleep apnea   fluticasone (FLONASE) 50 MCG/ACT nasal spray Administer 2 sprays in each nostril daily.   Multiple Vitamin (MULTIVITAMIN) tablet Take 1 tablet daily by mouth.   ondansetron  (ZOFRAN) 8 MG tablet Take 1 tablet (8 mg total) by mouth every 8 (eight) hours as needed for nausea or vomiting.   No current facility-administered medications on file prior to visit.    Allergies  Allergen Reactions   Hydrocodone      Social History   Socioeconomic History   Marital status: Married    Spouse name: Marchelle Folks   Number of children: Not on file   Years of education: Not on file   Highest education level: 12th grade  Occupational History   Not on file  Tobacco Use   Smoking status: Never   Smokeless tobacco: Never  Vaping Use   Vaping status: Never Used  Substance and Sexual Activity   Alcohol use: Never   Drug use: Never   Sexual activity: Yes  Other Topics Concern   Not on file  Social History Narrative   Not on file   Social Determinants of Health   Financial Resource Strain: Low Risk  (02/14/2023)   Overall Financial Resource Strain (CARDIA)    Difficulty of Paying Living Expenses: Not hard at all  Food Insecurity: No Food Insecurity (02/14/2023)   Hunger Vital Sign    Worried About Running Out of Food in the Last Year: Never true    Ran Out of Food in the Last Year: Never true  Transportation Needs: No Transportation Needs (02/14/2023)   PRAPARE - Administrator, Civil Service (Medical): No    Lack of Transportation (Non-Medical): No  Physical Activity: Sufficiently Active (02/14/2023)   Exercise Vital Sign    Days of Exercise per Week: 5 days    Minutes of Exercise per Session: 40 min  Stress: Stress Concern Present (02/14/2023)   [employer] of Occupational Health - Occupational Stress Questionnaire    Feeling of Stress : To some extent  Social Connections: Socially Integrated (02/14/2023)   Social Connection and Isolation Panel [NHANES]    Frequency of Communication with Friends and Family: Three times a week    Frequency of Social Gatherings with Friends and Family: Three times a week    Attends Religious Services: 1 to 4 times per  year    Active Member of Clubs or Organizations: Yes    Attends Banker Meetings: 1 to 4 times per year    Marital Status: Married  Catering manager Violence: Not on file   Social History   Tobacco Use  Smoking Status Never  Smokeless Tobacco Never   Social History   Substance and Sexual Activity  Alcohol Use Never     Family History  Problem Relation Age of Onset   Hypertension Father    Diabetes Maternal Aunt    Stroke Maternal  Grandmother    Diabetes Maternal Grandmother    Heart attack Maternal Grandmother    Cancer Paternal Grandmother      ROS: Denies fever, fatigue, unexplained weight loss/gain, CP, SHOB, and palpatitations. Denies neurological deficits, gastrointestinal and/or genitourinary complaints, and skin changes.   Objective:   Today's Vitals   02/14/23 1016  BP: 131/86  Pulse: 65  SpO2: 99%  Weight: 227 lb 14.4 oz (103.4 kg)  Height: 6' (1.829 m)    GENERAL APPEARANCE: Well-appearing, in NAD. Well nourished.  SKIN: Pink, warm and dry. Turgor normal. No rash, lesion, ulceration, or ecchymoses. Hair evenly distributed.  HEENT: HEAD: Normocephalic.  EYES: PERRLA. EOMI. Lids intact w/o defect. Sclera white, Conjunctiva pink w/o exudate.  EARS: External ear w/o redness, swelling, masses or lesions. EAC clear. TM's intact, translucent w/o bulging, appropriate landmarks visualized. Appropriate acuity to conversational tones.  NOSE: Septum midline w/o deformity. Nares patent, mucosa pink and non-inflamed w/o drainage. No sinus tenderness.  THROAT: Uvula midline. Oropharynx clear. Tonsils non-inflamed w/o exudate. Oral mucosa pink and moist.  NECK: Supple, Trachea midline. Full ROM w/o pain or tenderness. No lymphadenopathy. Thyroid non-tender w/o enlargement or palpable masses.  RESPIRATORY: Chest wall symmetrical w/o masses. Respirations even and non-labored. Breath sounds clear to auscultation bilaterally. No wheezes, rales, rhonchi, or  crackles. CARDIAC: S1, S2 present, regular rate and rhythm. No gallops, murmurs, rubs, or clicks. PMI w/o lifts, heaves, or thrills. No carotid bruits. Capillary refill <2 seconds. Peripheral pulses 2+ bilaterally. GI: Abdomen soft w/o distention. Normoactive bowel sounds. No palpable masses or tenderness. No guarding or rebound tenderness. Liver and spleen w/o tenderness or enlargement. No CVA tenderness.  GU: Pt deferred exam. MSK: Muscle tone and strength appropriate for age, w/o atrophy or abnormal movement. EXTREMITIES: Active ROM intact, w/o tenderness, crepitus, or contracture. No obvious joint deformities or effusions. No clubbing, edema, or cyanosis.  NEUROLOGIC: CN's II-XII intact. Motor strength symmetrical with no obvious weakness. No sensory deficits. DTR 2+ symmetric bilaterally. Steady, even gait.  PSYCH/MENTAL STATUS: Alert, oriented x 3. Cooperative, appropriate mood and affect.     Assessment & Plan:  1. Encounter for annual physical exam Discussed patient's recommended screenings (pt to schedule cscope), vaccines, and refilled chronic medications. Pt to obtain flu shot later in the season. Discussed healthy lifestyle recommendations and prevention.   - montelukast (SINGULAIR) 10 MG tablet; Take 1 tablet (10 mg total) by mouth at bedtime.  Dispense: 90 tablet; Refill: 3 - traZODone (DESYREL) 150 MG tablet; Take 1 tablet (150 mg total) by mouth at bedtime as needed for sleep  Dispense: 90 tablet; Refill: 3  2. Chronic fatigue Will obtain labs today for AE and check for vit d deficiencies, low Testosterone, thyroid disorder that may be contributing to fatigue. He is planning to get ENSPIRE device for his OSA in the next 3-4 months.   - VITAMIN D 25 Hydroxy (Vit-D Deficiency, Fractures) - Vitamin B12 - TSH - Testosterone,Free and Total - Lipid panel - Iron, TIBC and Ferritin Panel - Hemoglobin A1c - Comprehensive metabolic panel - CBC with Differential/Platelet  3.  Moderate episode of recurrent major depressive disorder (HCC) 4. Generalized anxiety disorder with panic attacks Discussed ongoing life stressors and triggering events, recommended patient with counseling, medication therapy. Declined change of medication or titration at this time. Pt knows he can speak to PCP at any time to change or titrate medications for improvement of symptoms. Safety plan reviewed.    No orders of the defined types were  placed in this encounter.   PATIENT COUNSELING: - Encouraged to adjust caloric intake to maintain or achieve ideal body weight, to reduce intake of dietary saturated fat and total fat, to limit sodium intake by avoiding high sodium foods and not adding table salt, and to maintain adequate dietary potassium and calcium preferably from fresh fruits, vegetables, and low-fat dairy products.   - Advised to avoid cigarette smoking. - Discussed with the patient that most people either abstain from alcohol or drink within safe limits (<=14/week and <=4 drinks/occasion for males, <=7/weeks and <= 3 drinks/occasion for females) and that the risk for alcohol disorders and other health effects rises proportionally with the number of drinks per week and how often a drinker exceeds daily limits. - Discussed cessation/primary prevention of drug use and availability of treatment for abuse.   - Stressed the importance of regular exercise - Injury prevention: Discussed safety belts, safety helmets, smoke detector, smoking near bedding or upholstery.  - Dental health: Discussed importance of regular tooth brushing, flossing, and dental visits.  - Sexuality: Discussed sexually transmitted diseases, partner selection, use of condoms, avoidance of unintended pregnancy  and contraceptive alternatives.   NEXT PREVENTATIVE PHYSICAL DUE IN 1 YEAR.  Return in about 1 year (around 02/14/2024) for ANNUAL PHYSICAL.  Patient to reach out to office if new, worrisome, or unresolved  symptoms arise or if no improvement in patient's condition. Patient verbalized understanding and is agreeable to treatment plan. All questions answered to patient's satisfaction.   Of note, portions of this note may have been created with voice recognition software Physicist, medical). While this note has been edited for accuracy, occasional wrong-word or 'sound-a-like' substitutions may have occurred due to the inherent limitations of voice recognition software.   Yolanda Manges, FNP

## 2023-02-14 NOTE — Patient Instructions (Signed)
Get your lab work drawn today. We will notify you of results.

## 2023-02-18 LAB — CBC WITH DIFFERENTIAL/PLATELET
Basophils Absolute: 0 10*3/uL (ref 0.0–0.2)
Basos: 1 %
EOS (ABSOLUTE): 0.1 10*3/uL (ref 0.0–0.4)
Eos: 1 %
Hematocrit: 45.6 % (ref 37.5–51.0)
Hemoglobin: 15.1 g/dL (ref 13.0–17.7)
Immature Grans (Abs): 0 10*3/uL (ref 0.0–0.1)
Immature Granulocytes: 1 %
Lymphocytes Absolute: 1.8 10*3/uL (ref 0.7–3.1)
Lymphs: 28 %
MCH: 31.1 pg (ref 26.6–33.0)
MCHC: 33.1 g/dL (ref 31.5–35.7)
MCV: 94 fL (ref 79–97)
Monocytes Absolute: 0.6 10*3/uL (ref 0.1–0.9)
Monocytes: 9 %
Neutrophils Absolute: 3.8 10*3/uL (ref 1.4–7.0)
Neutrophils: 60 %
Platelets: 247 10*3/uL (ref 150–450)
RBC: 4.85 x10E6/uL (ref 4.14–5.80)
RDW: 12.1 % (ref 11.6–15.4)
WBC: 6.3 10*3/uL (ref 3.4–10.8)

## 2023-02-18 LAB — TESTOSTERONE,FREE AND TOTAL
Testosterone, Free: 7.9 pg/mL (ref 6.8–21.5)
Testosterone: 438 ng/dL (ref 264–916)

## 2023-02-18 LAB — COMPREHENSIVE METABOLIC PANEL
ALT: 30 IU/L (ref 0–44)
AST: 27 IU/L (ref 0–40)
Albumin: 4.2 g/dL (ref 4.1–5.1)
Alkaline Phosphatase: 93 IU/L (ref 44–121)
BUN/Creatinine Ratio: 15 (ref 9–20)
BUN: 16 mg/dL (ref 6–24)
Bilirubin Total: 0.7 mg/dL (ref 0.0–1.2)
CO2: 25 mmol/L (ref 20–29)
Calcium: 9.2 mg/dL (ref 8.7–10.2)
Chloride: 100 mmol/L (ref 96–106)
Creatinine, Ser: 1.05 mg/dL (ref 0.76–1.27)
Globulin, Total: 2.5 g/dL (ref 1.5–4.5)
Glucose: 91 mg/dL (ref 70–99)
Potassium: 4.5 mmol/L (ref 3.5–5.2)
Sodium: 139 mmol/L (ref 134–144)
Total Protein: 6.7 g/dL (ref 6.0–8.5)
eGFR: 89 mL/min/{1.73_m2} (ref >59)

## 2023-02-18 LAB — IRON,TIBC AND FERRITIN PANEL
Ferritin: 621 ng/mL — ABNORMAL HIGH (ref 30–400)
Iron Saturation: 25 % (ref 15–55)
Iron: 62 ug/dL (ref 38–169)
Total Iron Binding Capacity: 245 ug/dL — ABNORMAL LOW (ref 250–450)
UIBC: 183 ug/dL (ref 111–343)

## 2023-02-18 LAB — LIPID PANEL
Chol/HDL Ratio: 2.2 ratio (ref 0.0–5.0)
Cholesterol, Total: 216 mg/dL — ABNORMAL HIGH (ref 100–199)
HDL: 99 mg/dL (ref >39)
LDL Chol Calc (NIH): 110 mg/dL — ABNORMAL HIGH (ref 0–99)
Triglycerides: 40 mg/dL (ref 0–149)
VLDL Cholesterol Cal: 7 mg/dL (ref 5–40)

## 2023-02-18 LAB — VITAMIN D 25 HYDROXY (VIT D DEFICIENCY, FRACTURES): Vit D, 25-Hydroxy: 42.4 ng/mL (ref 30.0–100.0)

## 2023-02-18 LAB — HEMOGLOBIN A1C
Est. average glucose Bld gHb Est-mCnc: 114 mg/dL
Hgb A1c MFr Bld: 5.6 % (ref 4.8–5.6)

## 2023-02-18 LAB — VITAMIN B12: Vitamin B-12: 923 pg/mL (ref 232–1245)

## 2023-02-18 LAB — TSH: TSH: 1.18 u[IU]/mL (ref 0.450–4.500)

## 2023-02-19 NOTE — Progress Notes (Signed)
Hi Ramaj, Your cholesterol is about the same from last year.  Your LDL or bad cholesterol has come down by about 10 points.  I do recommend continuing good dietary changes and exercise to continue to lower this.  Looks like in the past year with dietary changes you have been able to lower this.  Your blood counts overall look great.  Are you taking any iron supplements?  One of your iron levels is high and could be from increasing iron intake from diet, supplements, and also sometimes from chronic inflammation.  Your vitamin D, testosterone and B12 were normal as well and your thyroid level was normal.  Your A1c is borderline prediabetes.  As I mentioned above making dietary changes with decrease in red meats, carbohydrates, sweets and sugars and increasing good omega-3 fats such as fish, complex carbohydrates, lean meats, nuts and beans can help with your cholesterol and your A1c.  I recommend we recheck labs in 6 months.  If you have any questions please let me know

## 2023-02-26 ENCOUNTER — Other Ambulatory Visit: Payer: Self-pay

## 2023-03-10 ENCOUNTER — Encounter (HOSPITAL_BASED_OUTPATIENT_CLINIC_OR_DEPARTMENT_OTHER): Payer: Self-pay | Admitting: *Deleted

## 2023-03-20 ENCOUNTER — Other Ambulatory Visit (HOSPITAL_COMMUNITY): Payer: Self-pay

## 2023-03-20 ENCOUNTER — Other Ambulatory Visit: Payer: Self-pay

## 2023-03-24 IMAGING — DX DG SHOULDER 2+V*L*
3 series · 3 of 3 positions shown · non-contrast
Comparison: None.

CLINICAL DATA: Bilateral shoulder pain.  No known injury.

EXAM:
LEFT SHOULDER - 2+ VIEW

[shoulder grashey]
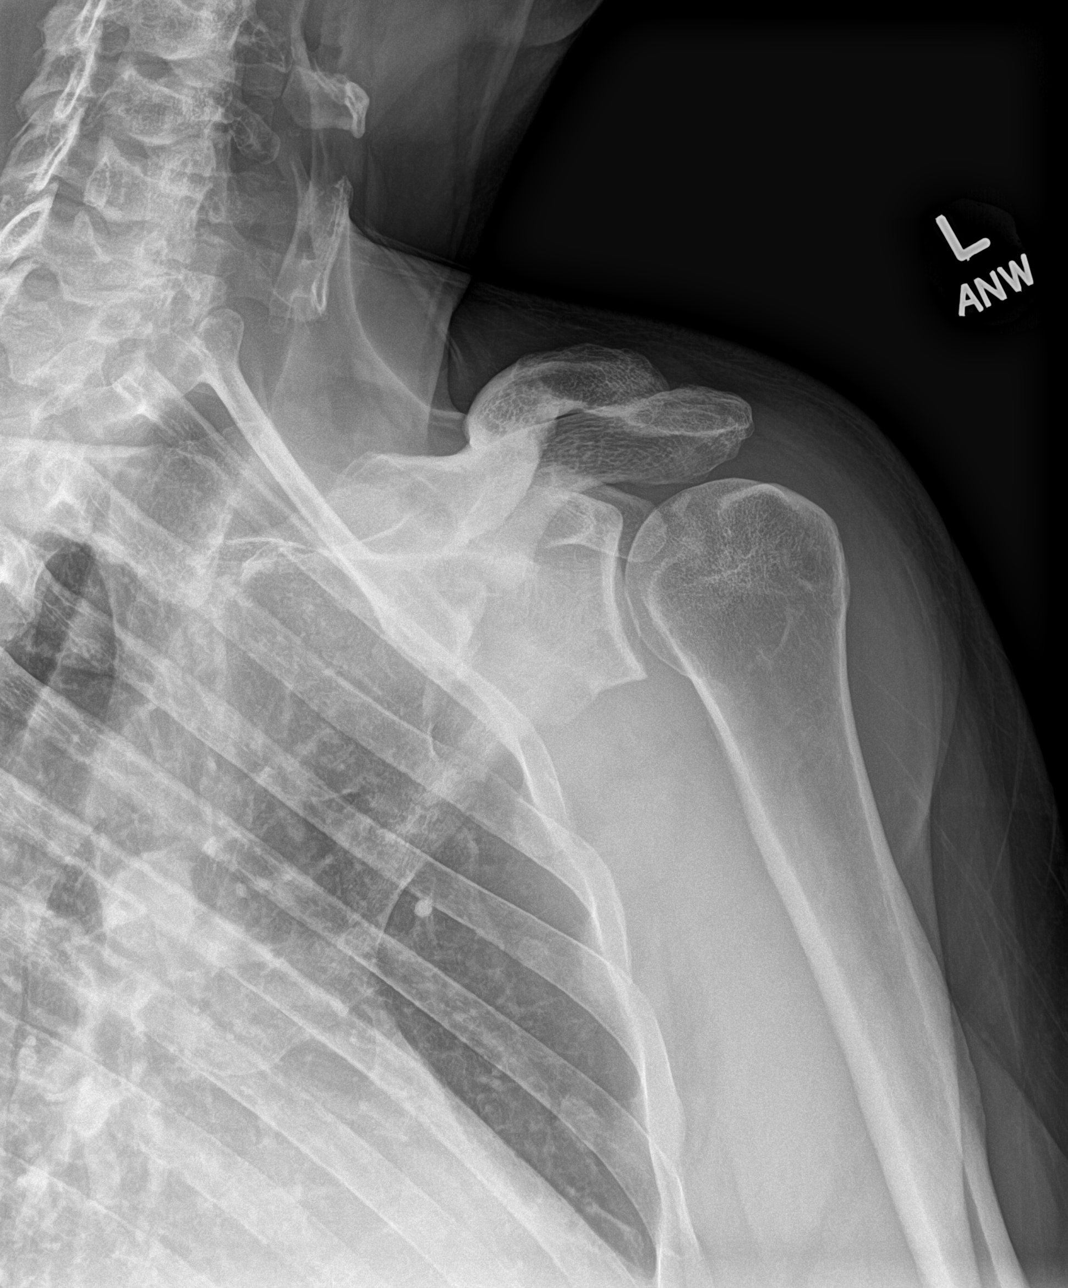

[shoulder y view]
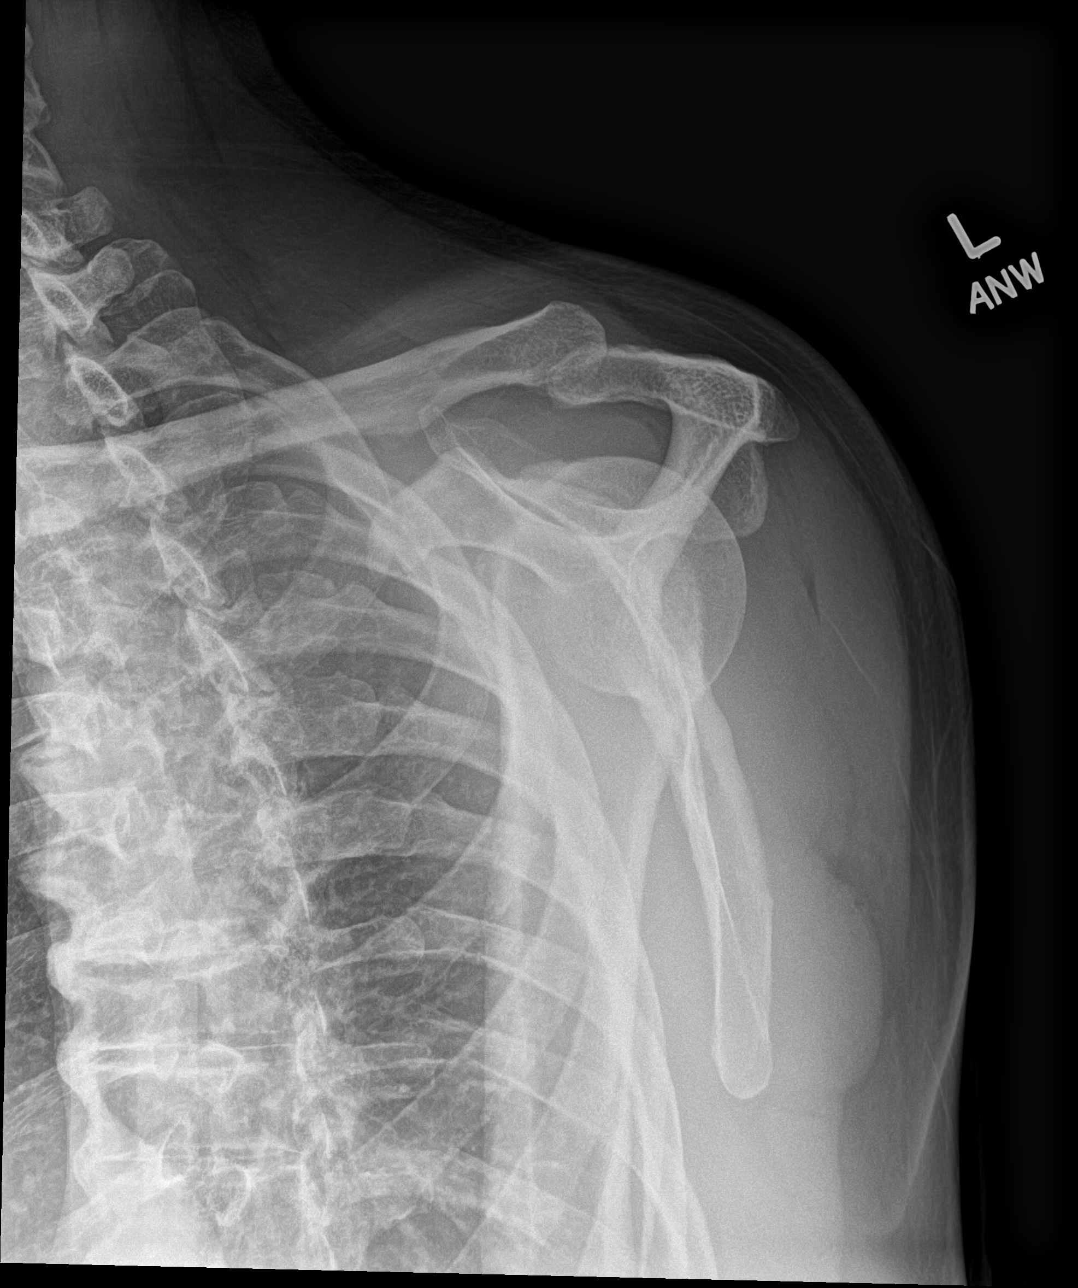

[shoulder axillary]
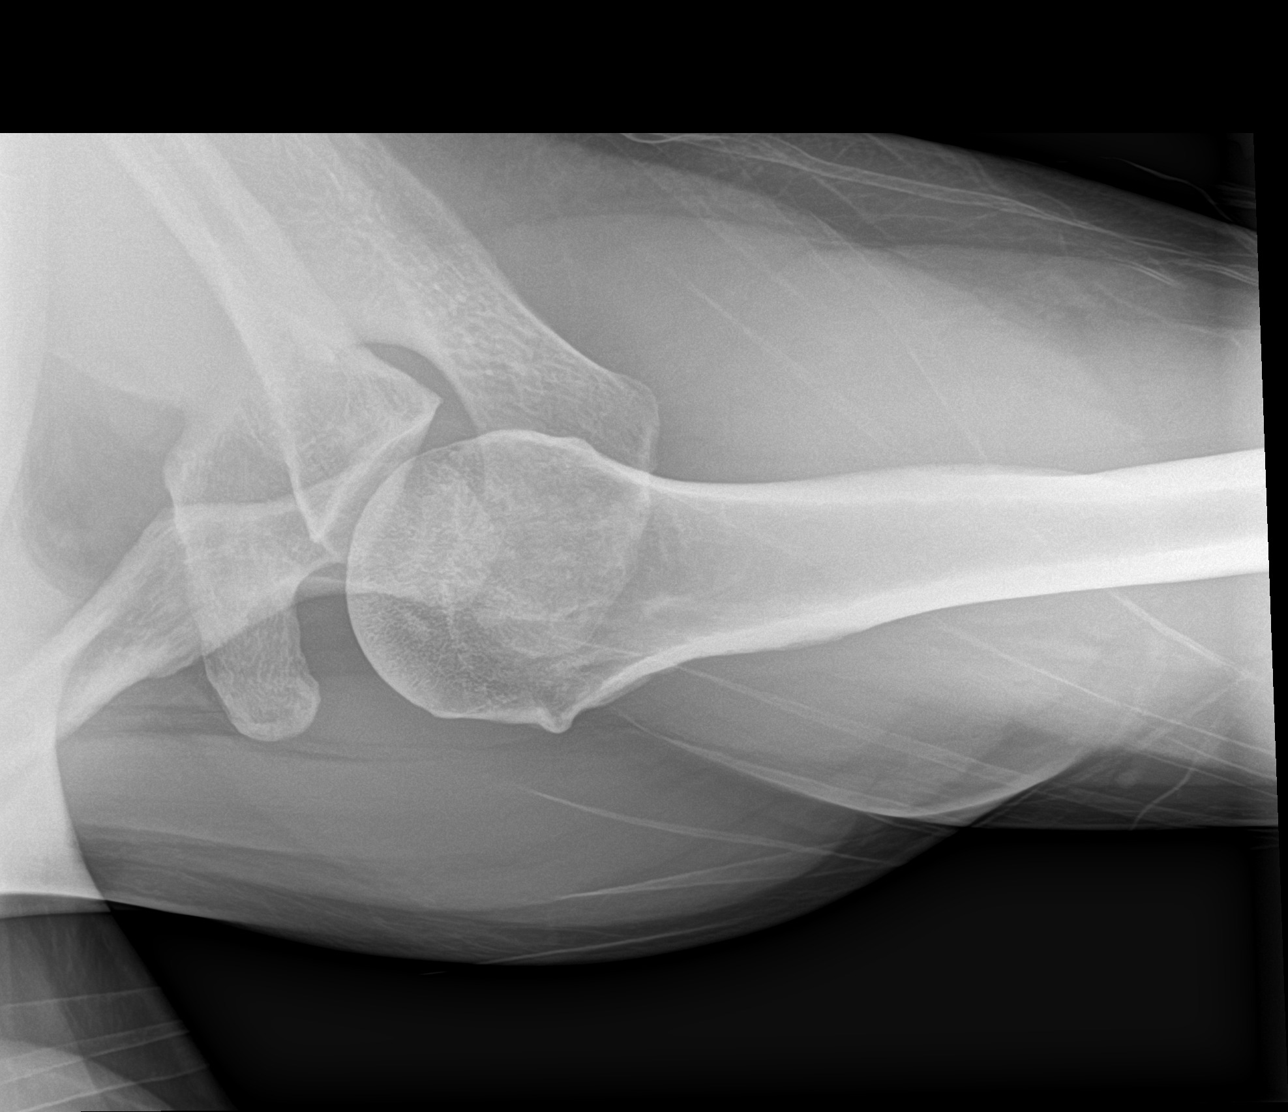

[3 of 3 positions shown; findings below may reference images not displayed]

FINDINGS: There is no evidence of fracture or dislocation. There is no
evidence of arthropathy or other focal bone abnormality. Soft
tissues are unremarkable.
IMPRESSION: Negative.

## 2023-03-24 IMAGING — DX DG SHOULDER 2+V*R*
3 series · 3 of 3 positions shown · non-contrast
Comparison: None.

CLINICAL DATA: Bilateral shoulder pain, no known injury

EXAM:
RIGHT SHOULDER - 2+ VIEW

[shoulder grashey]
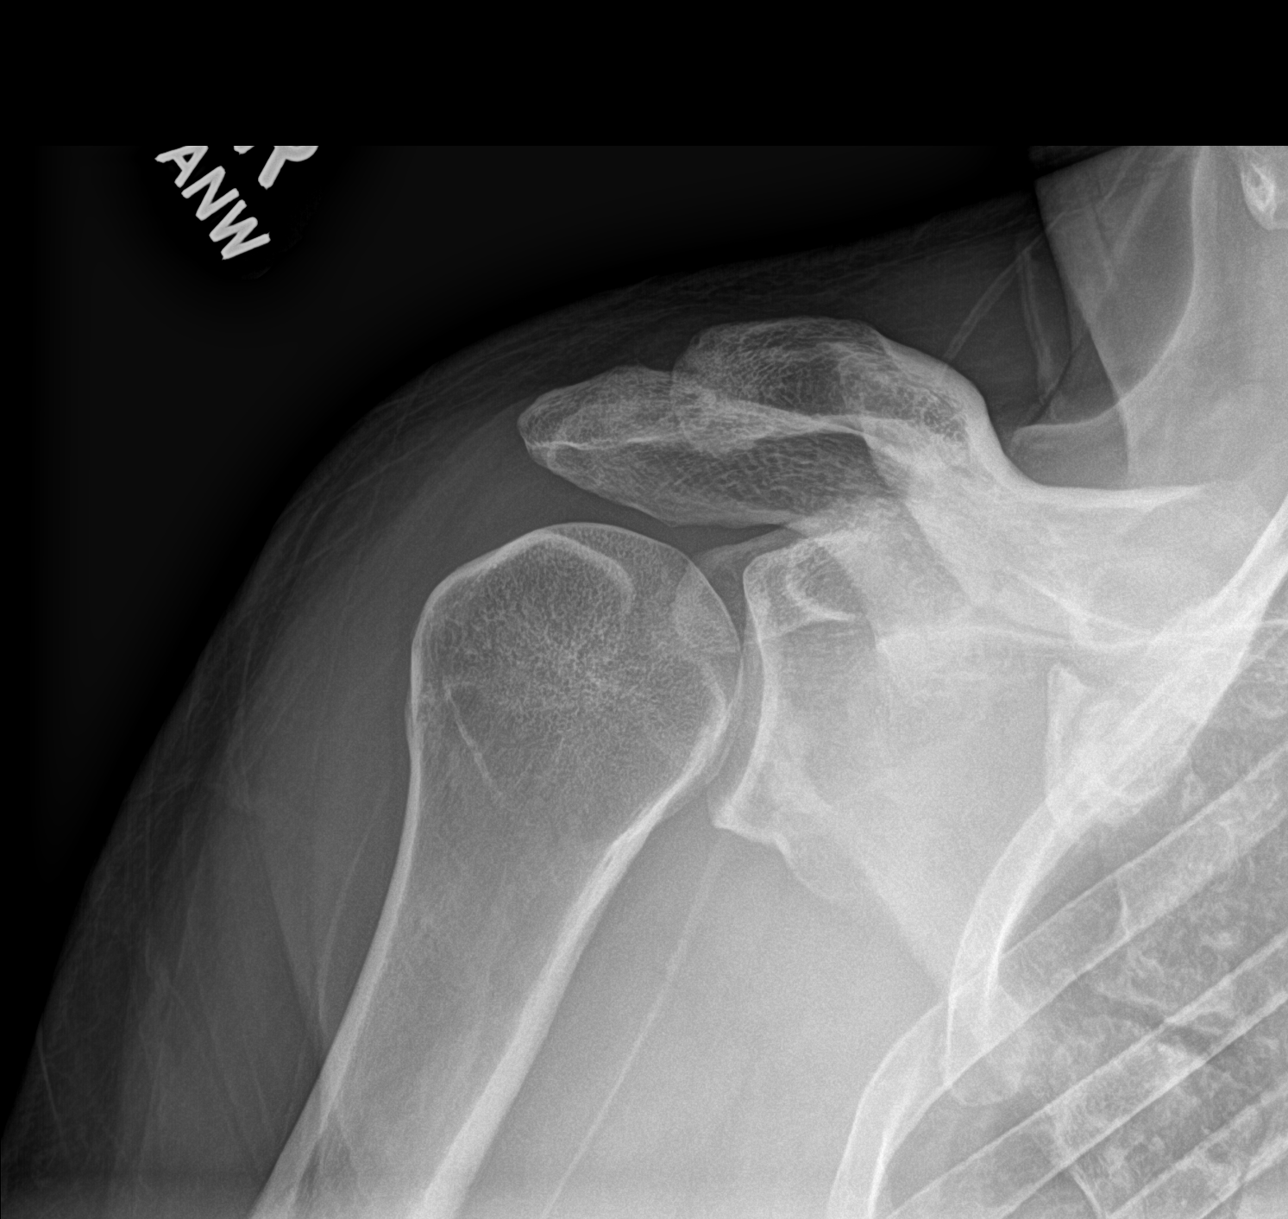

[shoulder y view]
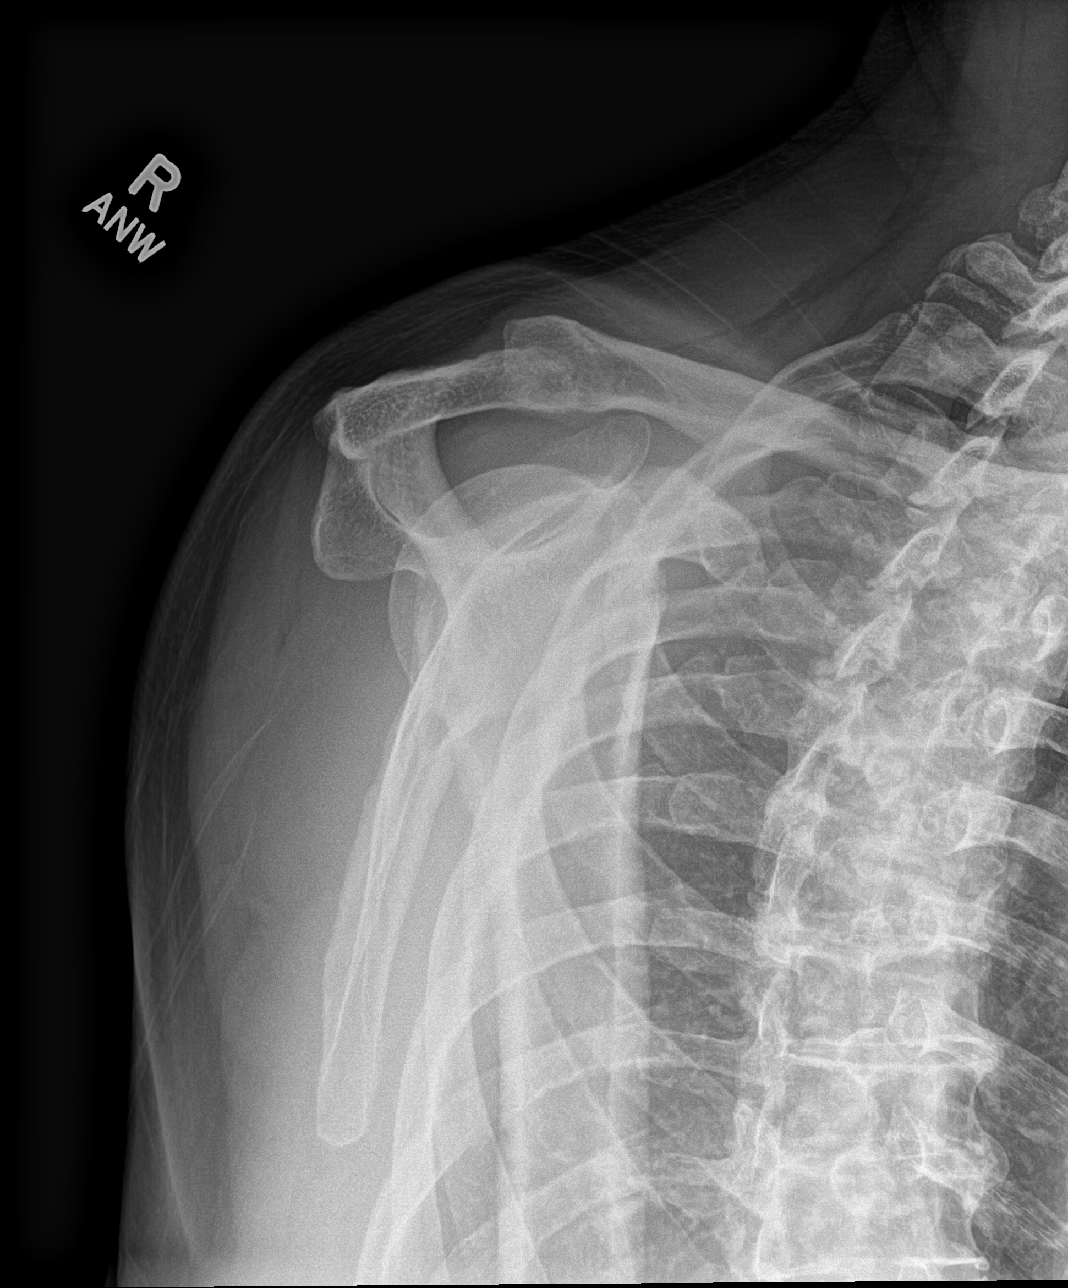

[shoulder axillary]
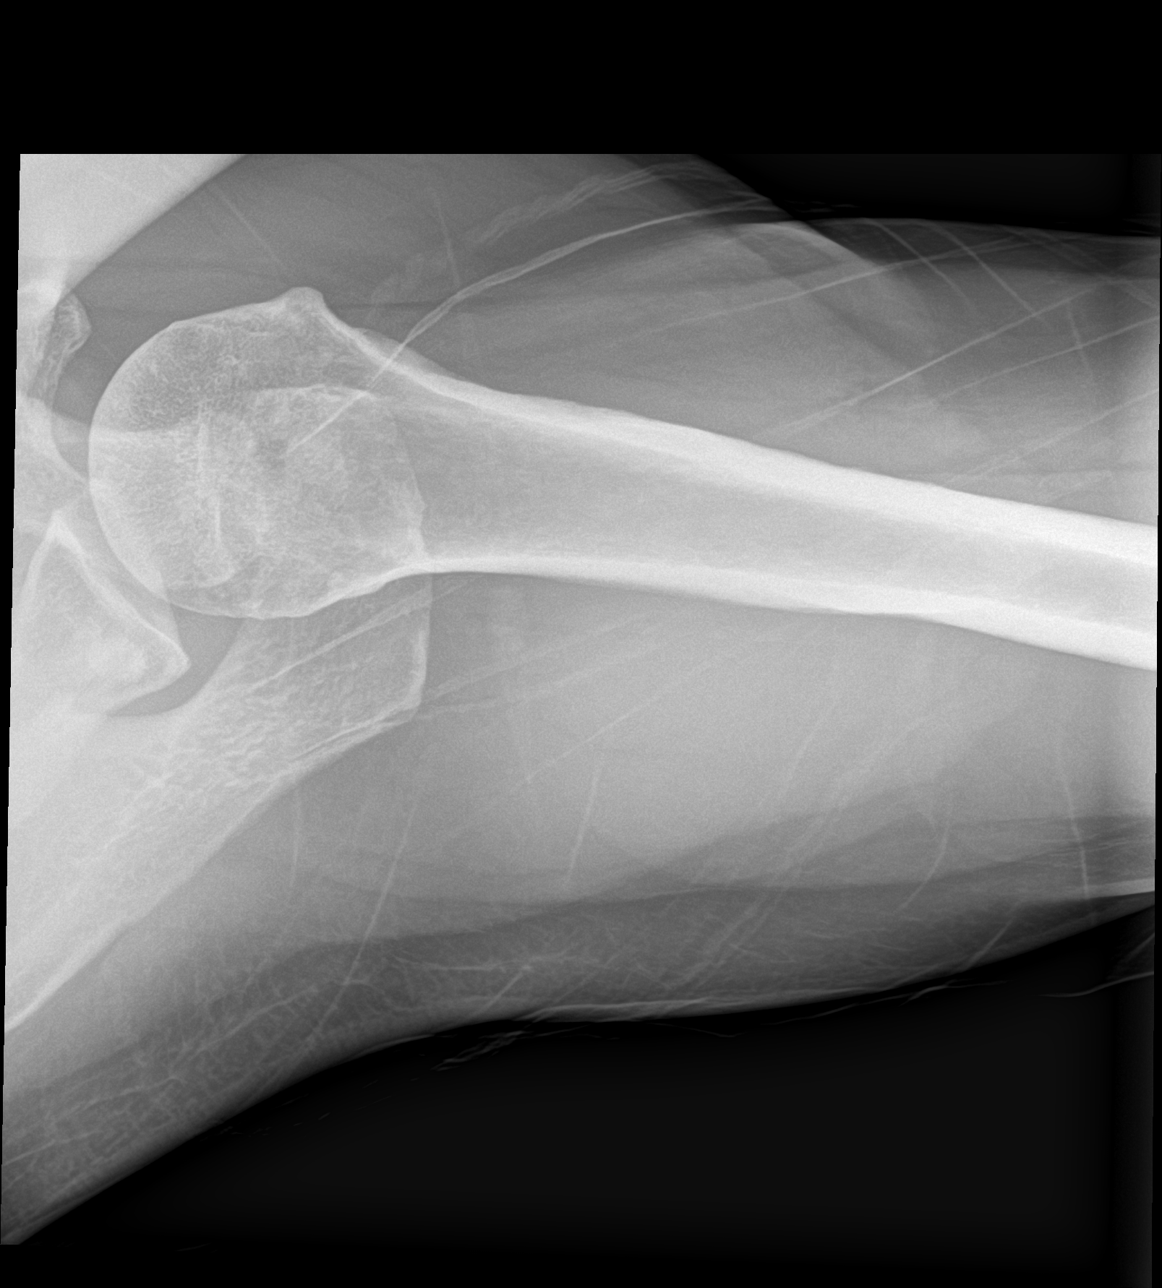

[3 of 3 positions shown; findings below may reference images not displayed]

FINDINGS: Degenerative changes in the AC joint with joint space narrowing and
spurring. Glenohumeral joint is maintained. No acute bony
abnormality. Specifically, no fracture, subluxation, or dislocation.
Soft tissues are intact.
IMPRESSION: Degenerative changes in the right AC joint. No acute bony
abnormality.

## 2023-04-14 DIAGNOSIS — G4733 Obstructive sleep apnea (adult) (pediatric): Secondary | ICD-10-CM | POA: Diagnosis not present

## 2023-04-14 DIAGNOSIS — Z6830 Body mass index (BMI) 30.0-30.9, adult: Secondary | ICD-10-CM | POA: Diagnosis not present

## 2023-04-16 NOTE — Pre-Procedure Instructions (Signed)
Surgical Instructions   Your procedure is scheduled on April 24, 2023. Report to [facility] Main Entrance "A" at 10:15 A.M., then check in with the Admitting office. Any questions or running late day of surgery: call [phone]  Questions prior to your surgery date: call [phone], Monday-Friday, 8am-4pm. If you experience any cold or flu symptoms such as cough, fever, chills, shortness of breath, etc. between now and your scheduled surgery, please notify us at the above number.     Remember:  Do not eat after midnight the night before your surgery   You may drink clear liquids until 9:15 AM the morning of your surgery.   Clear liquids allowed are: Water, Non-Citrus Juices (without pulp), Carbonated Beverages, Clear Tea (no milk, honey, etc.), Black Coffee Only (NO MILK, CREAM OR POWDERED CREAMER of any kind), and Gatorade.    Take these medicines the morning of surgery with A SIP OF WATER: DULoxetine (CYMBALTA)    May take these medicines IF NEEDED: fluticasone (FLONASE) nasal spray  ondansetron (ZOFRAN)    One week prior to surgery, STOP taking any Aspirin (unless otherwise instructed by your surgeon) Aleve, Naproxen, Ibuprofen, Motrin, Advil, Goody's, BC's, all herbal medications, fish oil, and non-prescription vitamins.                     Do NOT Smoke (Tobacco/Vaping) for 24 hours prior to your procedure.  If you use a CPAP at night, you may bring your mask/headgear for your overnight stay.   You will be asked to remove any contacts, glasses, piercing's, hearing aid's, dentures/partials prior to surgery. Please bring cases for these items if needed.    Patients discharged the day of surgery will not be allowed to drive home, and someone needs to stay with them for 24 hours.  SURGICAL WAITING ROOM VISITATION Patients may have no more than 2 support people in the waiting area - these visitors may rotate.   Pre-op nurse will coordinate an appropriate time for 1  ADULT support person, who may not rotate, to accompany patient in pre-op.  Children under the age of 22 must have an adult with them who is not the patient and must remain in the main waiting area with an adult.  If the patient needs to stay at the hospital during part of their recovery, the visitor guidelines for inpatient rooms apply.  Please refer to the [facility] website for the visitor guidelines for any additional information.   If you received a COVID test during your pre-op visit  it is requested that you wear a mask when out in public, stay away from anyone that may not be feeling well and notify your surgeon if you develop symptoms. If you have been in contact with anyone that has tested positive in the last 10 days please notify you surgeon.      Pre-operative CHG Bathing Instructions   You can play a key role in reducing the risk of infection after surgery. Your skin needs to be as free of germs as possible. You can reduce the number of germs on your skin by washing with CHG (chlorhexidine gluconate) soap before surgery. CHG is an antiseptic soap that kills germs and continues to kill germs even after washing.   DO NOT use if you have an allergy to chlorhexidine/CHG or antibacterial soaps. If your skin becomes reddened or irritated, stop using the CHG and notify one of our RNs at [phone].  TAKE A SHOWER THE NIGHT BEFORE SURGERY AND THE DAY OF SURGERY    Please keep in mind the following:  DO NOT shave, including legs and underarms, 48 hours prior to surgery.   You may shave your face before/day of surgery.  Place clean sheets on your bed the night before surgery Use a clean washcloth (not used since being washed) for each shower. DO NOT sleep with pet's night before surgery.  CHG Shower Instructions:  Wash your face and private area with normal soap. If you choose to wash your hair, wash first with your normal shampoo.  After you use shampoo/soap, rinse  your hair and body thoroughly to remove shampoo/soap residue.  Turn the water OFF and apply half the bottle of CHG soap to a CLEAN washcloth.  Apply CHG soap ONLY FROM YOUR NECK DOWN TO YOUR TOES (washing for 3-5 minutes)  DO NOT use CHG soap on face, private areas, open wounds, or sores.  Pay special attention to the area where your surgery is being performed.  If you are having back surgery, having someone wash your back for you may be helpful. Wait 2 minutes after CHG soap is applied, then you may rinse off the CHG soap.  Pat dry with a clean towel  Put on clean pajamas    Additional instructions for the day of surgery: DO NOT APPLY any lotions, deodorants, cologne, or perfumes.   Do not wear jewelry or makeup Do not wear nail polish, gel polish, artificial nails, or any other type of covering on natural nails (fingers and toes) Do not bring valuables to the hospital. [facility] is not responsible for valuables/personal belongings. Put on clean/comfortable clothes.  Please brush your teeth.  Ask your nurse before applying any prescription medications to the skin.

## 2023-04-17 ENCOUNTER — Other Ambulatory Visit: Payer: Self-pay

## 2023-04-17 ENCOUNTER — Encounter (HOSPITAL_COMMUNITY): Payer: Self-pay | Admitting: Vascular Surgery

## 2023-04-17 ENCOUNTER — Encounter (HOSPITAL_COMMUNITY): Payer: Self-pay

## 2023-04-17 ENCOUNTER — Encounter (HOSPITAL_COMMUNITY)
Admission: RE | Admit: 2023-04-17 | Discharge: 2023-04-17 | Disposition: A | Discharge status: Home / Self Care | Payer: 59 | Source: Ambulatory Visit | Attending: Otolaryngology | Admitting: Otolaryngology

## 2023-04-17 VITALS — BP 125/81 | HR 88 | Temp 98.9°F | Resp 17 | Ht 72.0 in | Wt 227.5 lb

## 2023-04-17 DIAGNOSIS — Z01818 Encounter for other preprocedural examination: Secondary | ICD-10-CM | POA: Diagnosis present

## 2023-04-17 DIAGNOSIS — Z01812 Encounter for preprocedural laboratory examination: Secondary | ICD-10-CM | POA: Insufficient documentation

## 2023-04-17 LAB — CBC
HCT: 42.3 % (ref 39.0–52.0)
Hemoglobin: 14.4 g/dL (ref 13.0–17.0)
MCH: 32 pg (ref 26.0–34.0)
MCHC: 34 g/dL (ref 30.0–36.0)
MCV: 94 fL (ref 80.0–100.0)
Platelets: 277 10*3/uL (ref 150–400)
RBC: 4.5 MIL/uL (ref 4.22–5.81)
RDW: 11.9 % (ref 11.5–15.5)
WBC: 8.1 10*3/uL (ref 4.0–10.5)
nRBC: 0 % (ref 0.0–0.2)

## 2023-04-17 NOTE — Progress Notes (Signed)
PCP - Hilbert Bible, FNP Cardiologist - Denies  PPM/ICD - Denies Device Orders - n/a Rep Notified - n/a  Chest x-ray - n/a EKG - Denies Stress Test - Denies ECHO - Denies Cardiac Cath - Denies  Sleep Study - +OSA. Unable to tolerate CPAP, thus current surgery.  No DM. Borderline Pre-DM  Last dose of GLP1 agonist- n/a GLP1 instructions: n/a  Blood Thinner Instructions: n/a Aspirin Instructions: n/a  ERAS Protcol - Clear liquids until 1130 morning of surgery PRE-SURGERY Ensure or G2- n/a  COVID TEST- n/a   Anesthesia review: No.   Patient denies shortness of breath, fever, cough and chest pain at PAT appointment. Pt denies any respiratory illness/infection in the last two months. He does endorse nasal congestion without cough, fever, runny nose, sore throat etc. Discussed with Shonna Chock, PA-C. Pt instructed to call if he develops any additional symptoms.   All instructions explained to the patient, with a verbal understanding of the material. Patient agrees to go over the instructions while at home for a better understanding. Patient also instructed to self quarantine after being tested for COVID-19. The opportunity to ask questions was provided.

## 2023-04-18 ENCOUNTER — Other Ambulatory Visit: Payer: Self-pay | Admitting: Otolaryngology

## 2023-04-24 ENCOUNTER — Ambulatory Visit (HOSPITAL_COMMUNITY): Admission: RE | Admit: 2023-04-24 | Payer: 59 | Source: Home / Self Care | Admitting: Otolaryngology

## 2023-04-24 ENCOUNTER — Encounter (HOSPITAL_COMMUNITY): Admission: RE | Payer: Self-pay | Source: Home / Self Care

## 2023-04-24 SURGERY — IMPLANTATION OF HYPOGLOSSAL NERVE STIMULATOR
Anesthesia: General | Laterality: Right

## 2023-05-16 ENCOUNTER — Other Ambulatory Visit: Payer: Self-pay | Admitting: Otolaryngology

## 2023-05-21 ENCOUNTER — Encounter (HOSPITAL_COMMUNITY): Payer: Self-pay | Admitting: Otolaryngology

## 2023-05-21 NOTE — Progress Notes (Signed)
SDW call  Patient was given pre-op instructions over the phone. Patient verbalized understanding of instructions provided.     PCP - Hilbert Bible, FNP Cardiologist -  Pulmonary:    PPM/ICD - denies Device Orders - na Rep Notified - na   Chest x-ray - na EKG -  na Stress Test - ECHO -  Cardiac Cath -   Sleep Study/sleep apnea/CPAP: OSA, unable to tolerate CPAP  Non-diabetic  ERAS Protcol - clears until 1145  COVID TEST- na    Anesthesia review: No   Patient denies shortness of breath, fever, cough and chest pain over the phone call  Your procedure is scheduled on Thursday December 12.2024  Report to [facility] Main Entrance "A" at 1215  PM then check in with the Admitting office.  Call this number if you have problems the morning of surgery:  [phone]   If you have any questions prior to your surgery date call [phone]: Open Monday-Friday 8am-4pm If you experience any cold or flu symptoms such as cough, fever, chills, shortness of breath, etc. between now and your scheduled surgery, please notify us at the above number     Remember:  Do not eat after midnight the night before your surgery  You may drink clear liquids until 1145    the morning of your surgery.   Clear liquids allowed are: Water, Non-Citrus Juices (without pulp), Carbonated Beverages, Clear Tea, Black Coffee ONLY (NO MILK, CREAM OR POWDERED CREAMER of any kind), and Gatorade   Take these medicines the morning of surgery with A SIP OF WATER:  cymbalta  As needed: Flonase, zofran  As of today, STOP taking any Aspirin (unless otherwise instructed by your surgeon) Aleve, Naproxen, Ibuprofen, Motrin, Advil, Goody's, BC's, all herbal medications, fish oil, and all vitamins.

## 2023-05-22 ENCOUNTER — Ambulatory Visit (HOSPITAL_COMMUNITY): Payer: 59

## 2023-05-22 ENCOUNTER — Ambulatory Visit (HOSPITAL_COMMUNITY): Payer: 59 | Admitting: Anesthesiology

## 2023-05-22 ENCOUNTER — Encounter (HOSPITAL_COMMUNITY)
Admission: RE | Disposition: A | Discharge status: Home / Self Care | Payer: Self-pay | Source: Ambulatory Visit | Attending: Otolaryngology

## 2023-05-22 ENCOUNTER — Other Ambulatory Visit: Payer: Self-pay

## 2023-05-22 ENCOUNTER — Ambulatory Visit (HOSPITAL_BASED_OUTPATIENT_CLINIC_OR_DEPARTMENT_OTHER): Payer: 59 | Admitting: Anesthesiology

## 2023-05-22 ENCOUNTER — Encounter (HOSPITAL_COMMUNITY): Payer: Self-pay | Admitting: Otolaryngology

## 2023-05-22 ENCOUNTER — Ambulatory Visit (HOSPITAL_COMMUNITY)
Admission: RE | Admit: 2023-05-22 | Discharge: 2023-05-22 | Disposition: A | Discharge status: Home / Self Care | Payer: 59 | Source: Ambulatory Visit | Attending: Otolaryngology | Admitting: Otolaryngology

## 2023-05-22 DIAGNOSIS — G4733 Obstructive sleep apnea (adult) (pediatric): Secondary | ICD-10-CM | POA: Insufficient documentation

## 2023-05-22 DIAGNOSIS — F411 Generalized anxiety disorder: Secondary | ICD-10-CM | POA: Diagnosis not present

## 2023-05-22 DIAGNOSIS — Z6830 Body mass index (BMI) 30.0-30.9, adult: Secondary | ICD-10-CM | POA: Diagnosis not present

## 2023-05-22 DIAGNOSIS — F32A Depression, unspecified: Secondary | ICD-10-CM | POA: Insufficient documentation

## 2023-05-22 DIAGNOSIS — Z9682 Presence of neurostimulator: Secondary | ICD-10-CM | POA: Diagnosis not present

## 2023-05-22 HISTORY — PX: IMPLANTATION OF HYPOGLOSSAL NERVE STIMULATOR: SHX6827

## 2023-05-22 SURGERY — INSERTION, HYPOGLOSSAL NERVE STIMULATOR
Anesthesia: General | Site: Chest | Laterality: Right

## 2023-05-22 MED ORDER — OXYCODONE HCL 5 MG/5ML PO SOLN: 5.0000 mg | Freq: Once | ORAL | Status: DC | PRN

## 2023-05-22 MED ORDER — FENTANYL CITRATE (PF) 250 MCG/5ML IJ SOLN
INTRAMUSCULAR | Status: AC
  Filled 2023-05-22: qty 5

## 2023-05-22 MED ORDER — LIDOCAINE-EPINEPHRINE 1 %-1:100000 IJ SOLN
INTRAMUSCULAR | Status: AC
  Filled 2023-05-22: qty 1

## 2023-05-22 MED ORDER — OXYCODONE HCL 5 MG PO TABS: 5.0000 mg | ORAL_TABLET | Freq: Once | ORAL | Status: DC | PRN

## 2023-05-22 MED ORDER — ACETAMINOPHEN 500 MG PO TABS
ORAL_TABLET | ORAL | Status: AC
  Administered 2023-05-22: 1000 mg via ORAL
  Filled 2023-05-22: qty 2

## 2023-05-22 MED ORDER — LIDOCAINE-EPINEPHRINE 1 %-1:100000 IJ SOLN
INTRAMUSCULAR | Status: DC | PRN
  Administered 2023-05-22: 5 mL

## 2023-05-22 MED ORDER — PROPOFOL 10 MG/ML IV BOLUS
INTRAVENOUS | Status: DC | PRN
  Administered 2023-05-22: 200 mg via INTRAVENOUS

## 2023-05-22 MED ORDER — PROPOFOL 10 MG/ML IV BOLUS
INTRAVENOUS | Status: AC
  Filled 2023-05-22: qty 20

## 2023-05-22 MED ORDER — SUCCINYLCHOLINE CHLORIDE 200 MG/10ML IV SOSY
PREFILLED_SYRINGE | INTRAVENOUS | Status: DC | PRN
  Administered 2023-05-22: 160 mg via INTRAVENOUS

## 2023-05-22 MED ORDER — DEXAMETHASONE SODIUM PHOSPHATE 10 MG/ML IJ SOLN
INTRAMUSCULAR | Status: DC | PRN
  Administered 2023-05-22: 10 mg via INTRAVENOUS

## 2023-05-22 MED ORDER — HYDROMORPHONE HCL 1 MG/ML IJ SOLN: 0.2500 mg | INTRAMUSCULAR | Status: DC | PRN

## 2023-05-22 MED ORDER — SODIUM CHLORIDE 0.9 % IV SOLN: INTRAVENOUS | Status: DC | PRN

## 2023-05-22 MED ORDER — HYDROCODONE-ACETAMINOPHEN 5-325 MG PO TABS: 1.0000 | ORAL_TABLET | Freq: Four times a day (QID) | ORAL | 0 refills | Status: DC | PRN

## 2023-05-22 MED ORDER — ORAL CARE MOUTH RINSE: 15.0000 mL | Freq: Once | OROMUCOSAL | Status: AC

## 2023-05-22 MED ORDER — ONDANSETRON HCL 4 MG/2ML IJ SOLN
INTRAMUSCULAR | Status: DC | PRN
  Administered 2023-05-22: 4 mg via INTRAVENOUS

## 2023-05-22 MED ORDER — ACETAMINOPHEN 500 MG PO TABS: 1000.0000 mg | ORAL_TABLET | Freq: Once | ORAL | Status: AC

## 2023-05-22 MED ORDER — 0.9 % SODIUM CHLORIDE (POUR BTL) OPTIME
TOPICAL | Status: DC | PRN
  Administered 2023-05-22: 1000 mL

## 2023-05-22 MED ORDER — CEFAZOLIN SODIUM-DEXTROSE 2-4 GM/100ML-% IV SOLN
2.0000 g | INTRAVENOUS | Status: AC
  Administered 2023-05-22: 2 g via INTRAVENOUS

## 2023-05-22 MED ORDER — ONDANSETRON HCL 8 MG PO TABS: 8.0000 mg | ORAL_TABLET | Freq: Three times a day (TID) | ORAL | 0 refills | Status: DC | PRN

## 2023-05-22 MED ORDER — MIDAZOLAM HCL 2 MG/2ML IJ SOLN
INTRAMUSCULAR | Status: DC | PRN
  Administered 2023-05-22: 2 mg via INTRAVENOUS

## 2023-05-22 MED ORDER — LIDOCAINE 2% (20 MG/ML) 5 ML SYRINGE
INTRAMUSCULAR | Status: DC | PRN
  Administered 2023-05-22: 100 mg via INTRAVENOUS

## 2023-05-22 MED ORDER — LACTATED RINGERS IV SOLN: INTRAVENOUS | Status: DC

## 2023-05-22 MED ORDER — MIDAZOLAM HCL 2 MG/2ML IJ SOLN
INTRAMUSCULAR | Status: AC
Start: 2023-05-22 — End: ?
  Filled 2023-05-22: qty 2

## 2023-05-22 MED ORDER — ONDANSETRON HCL 4 MG/2ML IJ SOLN: 4.0000 mg | Freq: Once | INTRAMUSCULAR | Status: DC | PRN

## 2023-05-22 MED ORDER — PHENYLEPHRINE 80 MCG/ML (10ML) SYRINGE FOR IV PUSH (FOR BLOOD PRESSURE SUPPORT)
PREFILLED_SYRINGE | INTRAVENOUS | Status: DC | PRN
  Administered 2023-05-22 (×2): 160 ug via INTRAVENOUS
  Administered 2023-05-22: 80 ug via INTRAVENOUS
  Administered 2023-05-22: 240 ug via INTRAVENOUS
  Administered 2023-05-22: 160 ug via INTRAVENOUS

## 2023-05-22 MED ORDER — CEFAZOLIN SODIUM-DEXTROSE 2-4 GM/100ML-% IV SOLN
INTRAVENOUS | Status: AC
  Filled 2023-05-22: qty 100

## 2023-05-22 MED ORDER — EPHEDRINE SULFATE-NACL 50-0.9 MG/10ML-% IV SOSY
PREFILLED_SYRINGE | INTRAVENOUS | Status: DC | PRN
  Administered 2023-05-22: 10 mg via INTRAVENOUS
  Administered 2023-05-22: 15 mg via INTRAVENOUS

## 2023-05-22 MED ORDER — FENTANYL CITRATE (PF) 250 MCG/5ML IJ SOLN
INTRAMUSCULAR | Status: DC | PRN
  Administered 2023-05-22: 150 ug via INTRAVENOUS
  Administered 2023-05-22: 100 ug via INTRAVENOUS

## 2023-05-22 MED ORDER — CHLORHEXIDINE GLUCONATE 0.12 % MT SOLN
15.0000 mL | Freq: Once | OROMUCOSAL | Status: AC
Start: 2023-05-22 — End: 2023-05-22
  Administered 2023-05-22: 15 mL via OROMUCOSAL

## 2023-05-22 MED ORDER — AMISULPRIDE (ANTIEMETIC) 5 MG/2ML IV SOLN: 10.0000 mg | Freq: Once | INTRAVENOUS | Status: DC | PRN

## 2023-05-22 SURGICAL SUPPLY — 62 items
BAG COUNTER SPONGE SURGICOUNT (BAG) ×1 IMPLANT
BLADE CLIPPER SURG (BLADE) IMPLANT
BLADE SURG 15 STRL LF DISP TIS (BLADE) ×3 IMPLANT
CANISTER SUCT 3000ML PPV (MISCELLANEOUS) ×1 IMPLANT
CORD BIPOLAR FORCEPS 12FT (ELECTRODE) ×1 IMPLANT
COVER PROBE W GEL 5X96 (DRAPES) ×1 IMPLANT
COVER SURGICAL LIGHT HANDLE (MISCELLANEOUS) ×1 IMPLANT
DERMABOND ADVANCED .7 DNX12 (GAUZE/BANDAGES/DRESSINGS) ×2 IMPLANT
DRAPE C-ARM 35X43 STRL (DRAPES) ×1 IMPLANT
DRAPE HEAD BAR (DRAPES) ×1 IMPLANT
DRAPE INCISE IOBAN 66X45 STRL (DRAPES) ×1 IMPLANT
DRAPE MICROSCOPE LEICA 54X105 (DRAPES) ×1 IMPLANT
DRAPE UTILITY XL STRL (DRAPES) ×1 IMPLANT
DRSG TEGADERM 4X4.75 (GAUZE/BANDAGES/DRESSINGS) ×3 IMPLANT
ELECT COATED BLADE 2.86 ST (ELECTRODE) ×1 IMPLANT
ELECT EMG 18 NIMS (NEUROSURGERY SUPPLIES) ×1
ELECT REM PT RETURN 9FT ADLT (ELECTROSURGICAL) ×1
ELECTRODE EMG 18 NIMS (NEUROSURGERY SUPPLIES) ×1 IMPLANT
ELECTRODE REM PT RTRN 9FT ADLT (ELECTROSURGICAL) ×1 IMPLANT
FORCEPS BIPOLAR SPETZLER 8 1.0 (NEUROSURGERY SUPPLIES) ×1 IMPLANT
GAUZE 4X4 16PLY ~~LOC~~+RFID DBL (SPONGE) ×1 IMPLANT
GAUZE SPONGE 4X4 12PLY STRL (GAUZE/BANDAGES/DRESSINGS) ×1 IMPLANT
GENERATOR PULSE INSPIRE (Generator) ×1 IMPLANT
GENERATOR PULSE INSPIRE IV (Generator) ×1 IMPLANT
GLOVE BIO SURGEON STRL SZ 6.5 (GLOVE) IMPLANT
GLOVE BIO SURGEON STRL SZ7.5 (GLOVE) ×1 IMPLANT
GOWN STRL REUS W/ TWL LRG LVL3 (GOWN DISPOSABLE) ×3 IMPLANT
KIT BASIN OR (CUSTOM PROCEDURE TRAY) ×1 IMPLANT
KIT NEURO ACCESSORY W/WRENCH (MISCELLANEOUS) IMPLANT
KIT TURNOVER KIT B (KITS) ×1 IMPLANT
LEAD SENSING RESP INSPIRE (Lead) ×1 IMPLANT
LEAD SENSING RESP INSPIRE IV (Lead) ×1 IMPLANT
LEAD SLEEP STIM INSPIRE IV/V (Lead) ×1 IMPLANT
LEAD SLEEP STIMULATION INSPIRE (Lead) ×1 IMPLANT
LOOP VASCLR MAXI BLUE 18IN ST (MISCELLANEOUS) ×1 IMPLANT
MARKER SKIN DUAL TIP RULER LAB (MISCELLANEOUS) ×2 IMPLANT
NDL HYPO 25GX1X1/2 BEV (NEEDLE) ×1 IMPLANT
NEEDLE HYPO 25GX1X1/2 BEV (NEEDLE) ×1
NS IRRIG 1000ML POUR BTL (IV SOLUTION) ×1 IMPLANT
PAD ARMBOARD 7.5X6 YLW CONV (MISCELLANEOUS) ×1 IMPLANT
PASSER CATH 38CM DISP (INSTRUMENTS) ×1 IMPLANT
PENCIL SMOKE EVACUATOR (MISCELLANEOUS) ×1 IMPLANT
POSITIONER HEAD DONUT 9IN (MISCELLANEOUS) ×1 IMPLANT
PROBE NERVE STIMULATOR (NEUROSURGERY SUPPLIES) ×1 IMPLANT
REMOTE CONTROL SLEEP INSPIRE (MISCELLANEOUS) ×1 IMPLANT
SET WALTER ACTIVATION W/DRAPE (SET/KITS/TRAYS/PACK) ×1 IMPLANT
SPONGE INTESTINAL PEANUT (DISPOSABLE) ×1 IMPLANT
STAPLER VISISTAT 35W (STAPLE) ×1 IMPLANT
SUT SILK 2 0 SH (SUTURE) ×1 IMPLANT
SUT SILK 3 0 REEL (SUTURE) ×1 IMPLANT
SUT SILK 3 0 SH 30 (SUTURE) ×2 IMPLANT
SUT SILK 3-0 RB1 30XBRD (SUTURE) ×1
SUT VIC AB 3-0 SH 27X BRD (SUTURE) ×2 IMPLANT
SUT VIC AB 4-0 PS2 27 (SUTURE) ×2 IMPLANT
SUTURE SILK 3-0 RB1 30XBRD (SUTURE) ×1 IMPLANT
SYR 10ML LL (SYRINGE) ×1 IMPLANT
TAPE CLOTH SURG 4X10 WHT LF (GAUZE/BANDAGES/DRESSINGS) ×1 IMPLANT
TIE VASCULAR MAXI BLUE 18IN ST (MISCELLANEOUS) ×1
TOWEL GREEN STERILE (TOWEL DISPOSABLE) ×1 IMPLANT
TRAY ENT MC OR (CUSTOM PROCEDURE TRAY) ×1 IMPLANT
VASCULAR TIE MAXI BLUE 18IN ST (MISCELLANEOUS) ×1
VASCULAR TIE MINI RED 18IN STL (MISCELLANEOUS) ×1 IMPLANT

## 2023-05-22 NOTE — Anesthesia Preprocedure Evaluation (Addendum)
Anesthesia Evaluation  Patient identified by MRN, date of birth, ID band Patient awake    Reviewed: Allergy & Precautions, NPO status , Patient's Chart, lab work & pertinent test results  Airway Mallampati: III  TM Distance: >3 FB Neck ROM: Full    Dental  (+) Teeth Intact, Dental Advisory Given   Pulmonary sleep apnea    Pulmonary exam normal breath sounds clear to auscultation       Cardiovascular negative cardio ROS Normal cardiovascular exam Rhythm:Regular Rate:Normal     Neuro/Psych  PSYCHIATRIC DISORDERS Anxiety Depression    negative neurological ROS     GI/Hepatic negative GI ROS, Neg liver ROS,,,  Endo/Other  negative endocrine ROS    Renal/GU negative Renal ROS  negative genitourinary   Musculoskeletal negative musculoskeletal ROS (+)    Abdominal   Peds  Hematology negative hematology ROS (+)   Anesthesia Other Findings   Reproductive/Obstetrics negative OB ROS                             Anesthesia Physical Anesthesia Plan  ASA: 2  Anesthesia Plan: General   Post-op Pain Management: Tylenol PO (pre-op)*   Induction: Intravenous  PONV Risk Score and Plan: 2 and Ondansetron, Dexamethasone, Midazolam and Treatment may vary due to age or medical condition  Airway Management Planned: Oral ETT  Additional Equipment: None  Intra-op Plan:   Post-operative Plan: Extubation in OR  Informed Consent: I have reviewed the patients History and Physical, chart, labs and discussed the procedure including the risks, benefits and alternatives for the proposed anesthesia with the patient or authorized representative who has indicated his/her understanding and acceptance.     Dental advisory given  Plan Discussed with: CRNA  Anesthesia Plan Comments:        Anesthesia Quick Evaluation

## 2023-05-22 NOTE — Brief Op Note (Signed)
05/22/2023  5:14 PM  PATIENT:  Marc Wiggins  47 y.o. male  PRE-OPERATIVE DIAGNOSIS:  Obstructive sleep apnea  POST-OPERATIVE DIAGNOSIS:  Obstructive sleep apnea  PROCEDURE:  Procedure(s): IMPLANTATION OF HYPOGLOSSAL NERVE STIMULATOR (Right)  SURGEON:  Surgeons and Role:    Christia Reading, MD - Primary  PHYSICIAN ASSISTANT:   ASSISTANTS: RNFA   ANESTHESIA:   general  EBL:  Minimal   BLOOD ADMINISTERED:none  DRAINS: none   LOCAL MEDICATIONS USED:  LIDOCAINE   SPECIMEN:  No Specimen  DISPOSITION OF SPECIMEN:  N/A  COUNTS:  YES  TOURNIQUET:  * No tourniquets in log *  DICTATION: .Note written in EPIC  PLAN OF CARE: Discharge to home after PACU  PATIENT DISPOSITION:  PACU - hemodynamically stable.   Delay start of Pharmacological VTE agent (>24hrs) due to surgical blood loss or risk of bleeding: no

## 2023-05-22 NOTE — Transfer of Care (Signed)
Immediate Anesthesia Transfer of Care Note  Patient: Marc Wiggins  Procedure(s) Performed: IMPLANTATION OF HYPOGLOSSAL NERVE STIMULATOR (Right: Chest)  Patient Location: PACU  Anesthesia Type:General  Level of Consciousness: awake, alert , and oriented  Airway & Oxygen Therapy: Patient Spontanous Breathing and Patient connected to face mask oxygen  Post-op Assessment: Report given to RN and Post -op Vital signs reviewed and stable  Post vital signs: Reviewed and stable  Last Vitals:  Vitals Value Taken Time  BP 126/75 05/22/23 1711  Temp    Pulse 89 05/22/23 1715  Resp 10 05/22/23 1714  SpO2 100 % 05/22/23 1715  Vitals shown include unfiled device data.  Last Pain:  Vitals:   05/22/23 1250  TempSrc:   PainSc: 0-No pain         Complications: No notable events documented.

## 2023-05-22 NOTE — Op Note (Signed)

## 2023-05-22 NOTE — H&P (Signed)
Marc Wiggins is an 47 y.o. male.   Chief Complaint: Sleep apnea HPI: 47 year old male with sleep apnea who has not been able to tolerate CPAP therapy.  Past Medical History:  Diagnosis Date   Encounter for annual physical exam 02/06/2022   Generalized anxiety disorder with panic attacks    Severe sleep apnea     Past Surgical History:  Procedure Laterality Date   CHOLECYSTECTOMY     2000s   DRUG INDUCED ENDOSCOPY Bilateral 06/11/2022   Procedure: DRUG INDUCED ENDOSCOPY;  Surgeon: Christia Reading, MD;  Location: [facility];  Service: ENT;  Laterality: Bilateral;   ENDOVENOUS ABLATION SAPHENOUS VEIN W/ LASER Right 04/29/2019   endovenous laser ablation right greater saphenous vein and stab phlebectomy 10-20 incisions right leg by Cari Caraway MD     Family History  Problem Relation Age of Onset   Hypertension Father    Diabetes Maternal Aunt    Stroke Maternal Grandmother    Diabetes Maternal Grandmother    Heart attack Maternal Grandmother    Cancer Paternal Grandmother    Social History:  reports that he has never smoked. He has never used smokeless tobacco. He reports that he does not drink alcohol and does not use drugs.  Allergies:  Allergies  Allergen Reactions   Hydrocodone     GI Upset    Medications Prior to Admission  Medication Sig Dispense Refill   doxylamine, Sleep, (UNISOM) 25 MG tablet Take 25 mg by mouth at bedtime.     DULoxetine (CYMBALTA) 20 MG capsule Take 2 capsules (40 mg total) by mouth daily. 180 capsule 3   fluticasone (FLONASE) 50 MCG/ACT nasal spray Administer 2 sprays in each nostril daily. (Patient taking differently: Place 2 sprays into both nostrils daily as needed for allergies.) 16 g 5   MELATONIN PO Take 12 mg by mouth at bedtime.     montelukast (SINGULAIR) 10 MG tablet Take 1 tablet (10 mg total) by mouth at bedtime. 90 tablet 3   Multiple Vitamin (MULTIVITAMIN) tablet Take 1 tablet daily by mouth.     traZODone  (DESYREL) 150 MG tablet Take 1 tablet (150 mg total) by mouth at bedtime as needed for sleep 90 tablet 3   AMBULATORY NON FORMULARY MEDICATION Supply ordered: BiPAP therapy on settings: 22/18 cm H2O. Please include other supplies needed (headgear, cushions, filters, heated tuubing and water chamber) Dx: obstructive sleep apnea 1 Units 99   ondansetron (ZOFRAN) 8 MG tablet Take 1 tablet (8 mg total) by mouth every 8 (eight) hours as needed for nausea or vomiting. 30 tablet 2    No results found for this or any previous visit (from the past 48 hours). No results found.  Review of Systems  All other systems reviewed and are negative.   Blood pressure 131/85, pulse 72, temperature 98.2 F (36.8 C), temperature source Oral, resp. rate 20, height 6' (1.829 m), weight 103 kg, SpO2 99%. Physical Exam Constitutional:      Appearance: Normal appearance. He is normal weight.  HENT:     Head: Normocephalic and atraumatic.     Right Ear: External ear normal.     Left Ear: External ear normal.     Nose: Nose normal.     Mouth/Throat:     Mouth: Mucous membranes are moist.     Pharynx: Oropharynx is clear.  Eyes:     Extraocular Movements: Extraocular movements intact.     Conjunctiva/sclera: Conjunctivae normal.  Pupils: Pupils are equal, round, and reactive to light.  Cardiovascular:     Rate and Rhythm: Normal rate.  Pulmonary:     Effort: Pulmonary effort is normal.  Musculoskeletal:        General: Normal range of motion.     Cervical back: Normal range of motion.  Skin:    General: Skin is warm and dry.  Neurological:     General: No focal deficit present.     Mental Status: He is alert and oriented to person, place, and time.  Psychiatric:        Mood and Affect: Mood normal.        Behavior: Behavior normal.        Thought Content: Thought content normal.        Judgment: Judgment normal.      Assessment/Plan Obstructive sleep apnea and BMI 30.79.  To OR for  hypoglossal nerve stimulator placement.  Christia Reading, MD 05/22/2023, 2:37 PM

## 2023-05-22 NOTE — Anesthesia Procedure Notes (Signed)
Procedure Name: Intubation Date/Time: 05/22/2023 3:20 PM  Performed by: Georgianne Fick D, CRNAPre-anesthesia Checklist: Patient identified, Emergency Drugs available, Suction available and Patient being monitored Patient Re-evaluated:Patient Re-evaluated prior to induction Oxygen Delivery Method: Circle System Utilized Preoxygenation: Pre-oxygenation with 100% oxygen Induction Type: IV induction Ventilation: Mask ventilation without difficulty Laryngoscope Size: Glidescope and 4 Grade View: Grade I Tube type: Oral Tube size: 7.5 mm Number of attempts: 2 (Difficulty lifting epiglottis with Mil3. Switched to GS without further complication.) Airway Equipment and Method: Stylet and Oral airway Placement Confirmation: ETT inserted through vocal cords under direct vision, positive ETCO2 and breath sounds checked- equal and bilateral Tube secured with: Tape Dental Injury: Teeth and Oropharynx as per pre-operative assessment

## 2023-05-23 ENCOUNTER — Encounter (HOSPITAL_COMMUNITY): Payer: Self-pay | Admitting: Otolaryngology

## 2023-05-23 NOTE — Anesthesia Postprocedure Evaluation (Signed)
Anesthesia Post Note  Patient: Marc Wiggins  Procedure(s) Performed: IMPLANTATION OF HYPOGLOSSAL NERVE STIMULATOR (Right: Chest)     Patient location during evaluation: PACU Anesthesia Type: General Level of consciousness: awake and alert Pain management: pain level controlled Vital Signs Assessment: post-procedure vital signs reviewed and stable Respiratory status: spontaneous breathing, nonlabored ventilation and respiratory function stable Cardiovascular status: stable and blood pressure returned to baseline Anesthetic complications: no   No notable events documented.  Last Vitals:  Vitals:   05/22/23 1730 05/22/23 1740  BP: (!) 139/90 135/89  Pulse: 84 85  Resp: 17 20  Temp:  36.7 C  SpO2: 98% 95%    Last Pain:  Vitals:   05/22/23 1740  TempSrc:   PainSc: 0-No pain                 Beryle Lathe

## 2023-05-29 DIAGNOSIS — G4733 Obstructive sleep apnea (adult) (pediatric): Secondary | ICD-10-CM | POA: Diagnosis not present

## 2023-06-06 ENCOUNTER — Other Ambulatory Visit (HOSPITAL_COMMUNITY): Payer: Self-pay

## 2023-06-06 ENCOUNTER — Other Ambulatory Visit: Payer: Self-pay

## 2023-06-24 ENCOUNTER — Other Ambulatory Visit: Payer: Self-pay

## 2023-06-25 NOTE — Progress Notes (Signed)
HPI male never smoker followed for OSA, complicated by Insomnia, Anxiety Disorder, Depression, Obesity HST 09/28/20- AHI 65.9/ hr, desaturation to 69% with 126 minutes </= 89%, body weight 232 lbs PAP titration 12/10/20- CPAP inadequate. Good control on BIPAP 22/18 with minO2 sat 96%  ===============================================================  12/27/21- 48 year old male never smoker followed for OSA, complicated by Insomnia, Anxiety Disorder, Depression, Obesity, Rhinitis,  -trazodone,   Note semaglutide for weight BIPAP IPAP18 / EPAP 6/ [employer]            pressure ordered 05/14/21  Download-compliance 73.3%, AHI 33/ hr Body weight today-237 lbs  BMI 32.1 Covid vax-2 Moderna Flu vax-had -----Pt f/u BiPAP doesn't seem to be working well. 6-7hr sleep/night. Pt has concerns that he still isn't feeling well when waking up and can't tell any difference from having the machine We discussed comfort after options.  Still having gas pains from aerophagia making it unlikely he would tolerate a higher pressure. He had tried and on fitted oral appliance earlier with little benefit. He was previously titrated with good control on BiPAP 22/16 but he is not tolerating pressure close to that at home.  His weight is now in the range indicated when he saw ENT/Dr. Pollyann Kennedy, for possible Inspire consideration.  06/26/23- 48 year old male never smoker followed for OSA/ Inspire, complicated by Insomnia, Anxiety Disorder, Depression, Obesity, Rhinitis,  -trazodone,   Note semaglutide for weight BIPAP IPAP18 / EPAP 6/ [employer]            pressure ordered 05/14/21    Failed to tolerate. Inspire Implantation 05/22/23/ Dr Jenne Pane For Inspire initiation Body weight today-243 lbs Starting now at 0.9V. Inspire tem here. Teaching and questions answered.  No other concerns expressed. Discussed the use of AI scribe software for clinical note transcription with the patient, who gave verbal consent to proceed.  History of Present  Illness   The patient, with a history of sleep apnea, presents for activation and initial setup of his recently implanted Inspire therapy device. He reports no problems with the incisions or changes in his general health since the implantation. The patient has been looking forward to starting the therapy and is eager to learn how to use the device. He expresses understanding of the instructions given and successfully demonstrates how to use the remote control to adjust therapy levels. The patient also understands the need to keep the [facility] therapy app open to track usage and answer questions about sleep quality.     ROS-see HPI   + = positive Constitutional:    weight loss, night sweats, fevers, chills, fatigue, lassitude. HEENT:    headaches, difficulty swallowing, tooth/dental problems, sore throat,       sneezing, itching, ear ache, +nasal congestion, post nasal drip, snoring CV:    chest pain, orthopnea, PND, swelling in lower extremities, anasarca,       dizziness, palpitations Resp:   shortness of breath with exertion or at rest.                productive cough,   non-productive cough, coughing up of blood.              change in color of mucus.  wheezing.   Skin:    rash or lesions. GI:  +heartburn, indigestion, abdominal pain, nausea, vomiting, diarrhea,                 change in bowel habits, loss of appetite GU: dysuria, change in color of urine, no urgency or frequency.  flank pain. MS:   joint pain, stiffness, decreased range of motion, back pain. Neuro-     nothing unusual Psych:  change in mood or affect.  depression or anxiety.   memory loss.   OBJ- Physical Exam General- Alert, Oriented, Affect-appropriate, Distress- none acute, +Obese Skin- rash-none, lesions- none, excoriation- none Lymphadenopathy- none Head- atraumatic            Eyes- Gross vision intact, PERRLA, conjunctivae and secretions clear            Ears- Hearing, canals-normal            Nose- Clear,  no-Septal dev, mucus, polyps, erosion, perforation             Throat- Mallampati IV , mucosa clear , drainage- none, tonsils+, + teeth Neck- flexible , trachea midline, no stridor , thyroid nl, carotid no bruit Chest - symmetrical excursion , unlabored           Heart/CV- RRR , no murmur , no gallop  , no rub, nl s1 s2                           - JVD- none , edema- none, stasis changes- none, varices- none           Lung- clear to P&A, wheeze- none, cough- none , dullness-none, rub- none           Chest wall-  Abd-  Br/ Gen/ Rectal- Not done, not indicated Extrem- cyanosis- none, clubbing, none, atrophy- none, strength- nl Neuro- grossly intact to observation  Assessment and Plan    Sleep Apnea Recently implanted Inspire device. No complications from the incision. No changes in general health. Device activation successful with comfortable stimulation at 0.9V level. -Continue to use Inspire device nightly, increasing levels as tolerated every Thursday. -Use Inspire app to track usage and answer prompted questions. -Return for follow-up in 4-6 weeks. -Plan for in-lab titration study after next follow-up visit.

## 2023-06-26 ENCOUNTER — Encounter: Payer: Self-pay | Admitting: Internal Medicine

## 2023-06-26 ENCOUNTER — Ambulatory Visit: Payer: Commercial Managed Care - PPO | Admitting: Internal Medicine

## 2023-06-26 VITALS — BP 123/77 | HR 73 | Temp 98.1°F | Ht 72.0 in | Wt 243.4 lb

## 2023-06-26 DIAGNOSIS — G4733 Obstructive sleep apnea (adult) (pediatric): Secondary | ICD-10-CM | POA: Diagnosis not present

## 2023-06-26 NOTE — Patient Instructions (Signed)
We are starting at .9V.  You can increase as tolerated by .1V per week on Thursdays until we see you at your next appointment.

## 2023-07-24 NOTE — Progress Notes (Unsigned)
HPI male never smoker followed for OSA, complicated by Insomnia, Anxiety Disorder, Depression, Obesity HST 09/28/20- AHI 65.9/ hr, desaturation to 69% with 126 minutes </= 89%, body weight 232 lbs PAP titration 12/10/20- CPAP inadequate. Good control on BIPAP 22/18 with minO2 sat 96%  ===============================================================  12/27/21- 48 year old male never smoker followed for OSA, complicated by Insomnia, Anxiety Disorder, Depression, Obesity, Rhinitis,  -trazodone,   Note semaglutide for weight BIPAP IPAP18 / EPAP 6/ [employer]            pressure ordered 05/14/21  Download-compliance 73.3%, AHI 33/ hr Body weight today-237 lbs  BMI 32.1 Covid vax-2 Moderna Flu vax-had -----Pt f/u BiPAP doesn't seem to be working well. 6-7hr sleep/night. Pt has concerns that he still isn't feeling well when waking up and can't tell any difference from having the machine We discussed comfort after options.  Still having gas pains from aerophagia making it unlikely he would tolerate a higher pressure. He had tried and on fitted oral appliance earlier with little benefit. He was previously titrated with good control on BiPAP 22/16 but he is not tolerating pressure close to that at home.  His weight is now in the range indicated when he saw ENT/Dr. Pollyann Kennedy, for possible Inspire consideration.  06/26/23- 48 year old male never smoker followed for OSA/ Inspire, complicated by Insomnia, Anxiety Disorder, Depression, Obesity, Rhinitis,  -trazodone,   Note semaglutide for weight BIPAP IPAP18 / EPAP 6/ [employer]            pressure ordered 05/14/21    Failed to tolerate. Inspire Implantation 05/22/23/ Dr Jenne Pane For Inspire initiation Body weight today-243 lbs Starting now at 0.9V. Inspire tem here. Teaching and questions answered.  No other concerns expressed. Discussed the use of AI scribe software for clinical note transcription with the patient, who gave verbal consent to proceed.  History of Present  Illness   The patient, with a history of sleep apnea, presents for activation and initial setup of his recently implanted Inspire therapy device. He reports no problems with the incisions or changes in his general health since the implantation. The patient has been looking forward to starting the therapy and is eager to learn how to use the device. He expresses understanding of the instructions given and successfully demonstrates how to use the remote control to adjust therapy levels. The patient also understands the need to keep the [facility] therapy app open to track usage and answer questions about sleep quality.   Assessment and Plan    Sleep Apnea Recently implanted Inspire device. No complications from the incision. No changes in general health. Device activation successful with comfortable stimulation at 0.9V level. -Continue to use Inspire device nightly, increasing levels as tolerated every Thursday. -Use Inspire app to track usage and answer prompted questions. -Return for follow-up in 4-6 weeks. -Plan for in-lab titration study after next follow-up visit.     ROS-see HPI   + = positive Constitutional:    weight loss, night sweats, fevers, chills, fatigue, lassitude. HEENT:    headaches, difficulty swallowing, tooth/dental problems, sore throat,       sneezing, itching, ear ache, +nasal congestion, post nasal drip, snoring CV:    chest pain, orthopnea, PND, swelling in lower extremities, anasarca,       dizziness, palpitations Resp:   shortness of breath with exertion or at rest.                productive cough,   non-productive cough, coughing up of blood.  change in color of mucus.  wheezing.   Skin:    rash or lesions. GI:  +heartburn, indigestion, abdominal pain, nausea, vomiting, diarrhea,                 change in bowel habits, loss of appetite GU: dysuria, change in color of urine, no urgency or frequency.   flank pain. MS:   joint pain, stiffness, decreased range  of motion, back pain. Neuro-     nothing unusual Psych:  change in mood or affect.  depression or anxiety.   memory loss.   OBJ- Physical Exam General- Alert, Oriented, Affect-appropriate, Distress- none acute, +Obese Skin- rash-none, lesions- none, excoriation- none Lymphadenopathy- none Head- atraumatic            Eyes- Gross vision intact, PERRLA, conjunctivae and secretions clear            Ears- Hearing, canals-normal            Nose- Clear, no-Septal dev, mucus, polyps, erosion, perforation             Throat- Mallampati IV , mucosa clear , drainage- none, tonsils+, + teeth Neck- flexible , trachea midline, no stridor , thyroid nl, carotid no bruit Chest - symmetrical excursion , unlabored           Heart/CV- RRR , no murmur , no gallop  , no rub, nl s1 s2                           - JVD- none , edema- none, stasis changes- none, varices- none           Lung- clear to P&A, wheeze- none, cough- none , dullness-none, rub- none           Chest wall-  Abd-  Br/ Gen/ Rectal- Not done, not indicated Extrem- cyanosis- none, clubbing, none, atrophy- none, strength- nl Neuro- grossly intact to observation  07/25/23- 48 year old male never smoker followed for OSA/ Inspire, complicated by Insomnia, Anxiety Disorder, Depression, Obesity, Rhinitis,  -trazodone,   Note semaglutide for weight BIPAP IPAP18 / EPAP 6/ [employer]            pressure ordered 05/14/21    Failed to tolerate. Inspire Implantation 05/22/23/ Dr Jenne Pane Body weight today-240 lbs Titration was to advance from 0.9V after last visit. Currently at 1.3V as of arrival today. Will continue to advance as tolerated. Discussed the use of AI scribe software for clinical note transcription with the patient, who gave verbal consent to proceed.   History of Present Illness   The patient, with a history of sleep apnea, has been using an Inspire device to manage his condition. He reports feeling better in the morning and sleeping better since  starting to use the device. The patient's wife has also noticed that the patient's snoring stops once the device turns on. The patient has not noticed any discomfort or side effects from the device.     Assessment and Plan:    Obstructive Sleep Apnea Patient reports improvement in sleep quality and daytime energy with Inspire device. Noted that snoring ceases once device activates. No discomfort reported with device use. -Shorten startup interval of Inspire device to 20 minutes. -Continue to increase device levels weekly. -Schedule Inspire titration in-lab sleep study in April. -Return for readiness check prior to sleep study. -Complete Epworth Sleepiness Scale survey after in-lab sleep study.        ROS-see HPI   + =  positive Constitutional:    weight loss, night sweats, fevers, chills, fatigue, lassitude. HEENT:    headaches, difficulty swallowing, tooth/dental problems, sore throat,       sneezing, itching, ear ache, +nasal congestion, post nasal drip, snoring CV:    chest pain, orthopnea, PND, swelling in lower extremities, anasarca,       dizziness, palpitations Resp:   shortness of breath with exertion or at rest.                productive cough,   non-productive cough, coughing up of blood.              change in color of mucus.  wheezing.   Skin:    rash or lesions. GI:  +heartburn, indigestion, abdominal pain, nausea, vomiting, diarrhea,                 change in bowel habits, loss of appetite GU: dysuria, change in color of urine, no urgency or frequency.   flank pain. MS:   joint pain, stiffness, decreased range of motion, back pain. Neuro-     nothing unusual Psych:  change in mood or affect.  depression or anxiety.   memory loss.   OBJ- Physical Exam General- Alert, Oriented, Affect-appropriate, Distress- none acute, +Obese Skin- rash-none, lesions- none, excoriation- none Lymphadenopathy- none Head- atraumatic            Eyes- Gross vision intact, PERRLA, conjunctivae  and secretions clear            Ears- Hearing, canals-normal            Nose- Clear, no-Septal dev, mucus, polyps, erosion, perforation             Throat- Mallampati IV , mucosa clear , drainage- none, tonsils+, + teeth Neck- flexible , trachea midline, no stridor , thyroid nl, carotid no bruit Chest - symmetrical excursion , unlabored           Heart/CV- RRR , no murmur , no gallop  , no rub, nl s1 s2                           - JVD- none , edema- none, stasis changes- none, varices- none           Lung- clear to P&A, wheeze- none, cough- none , dullness-none, rub- none           Chest wall-  Abd-  Br/ Gen/ Rectal- Not done, not indicated Extrem- cyanosis- none, clubbing, none, atrophy- none, strength- nl Neuro- grossly intact to observation

## 2023-07-25 ENCOUNTER — Ambulatory Visit: Payer: Commercial Managed Care - PPO | Admitting: Internal Medicine

## 2023-07-25 ENCOUNTER — Encounter: Payer: Self-pay | Admitting: Internal Medicine

## 2023-07-25 ENCOUNTER — Other Ambulatory Visit: Payer: Self-pay | Admitting: Internal Medicine

## 2023-07-25 VITALS — BP 114/78 | HR 76 | Temp 98.5°F | Ht 72.0 in | Wt 240.6 lb

## 2023-07-25 DIAGNOSIS — G4733 Obstructive sleep apnea (adult) (pediatric): Secondary | ICD-10-CM

## 2023-07-25 NOTE — Patient Instructions (Addendum)
Order- Schedule Inspire Titration in-center sleep study in April     dx OSA

## 2023-08-18 ENCOUNTER — Encounter (HOSPITAL_BASED_OUTPATIENT_CLINIC_OR_DEPARTMENT_OTHER): Payer: Self-pay | Admitting: Family Medicine

## 2023-08-23 NOTE — Progress Notes (Unsigned)
 HPI male never smoker followed for OSA, complicated by Insomnia, Anxiety Disorder, Depression, Obesity HST 09/28/20- AHI 65.9/ hr, desaturation to 69% with 126 minutes </= 89%, body weight 232 lbs PAP titration 12/10/20- CPAP inadequate. Good control on BIPAP 22/18 with minO2 sat 96%  ===============================================================  12/27/21- 48 year old male never smoker followed for OSA, complicated by Insomnia, Anxiety Disorder, Depression, Obesity, Rhinitis,  -trazodone,   Note semaglutide for weight BIPAP IPAP18 / EPAP 6/ [employer]            pressure ordered 05/14/21  Download-compliance 73.3%, AHI 33/ hr Body weight today-237 lbs  BMI 32.1 Covid vax-2 Moderna Flu vax-had -----Pt f/u BiPAP doesn't seem to be working well. 6-7hr sleep/night. Pt has concerns that he still isn't feeling well when waking up and can't tell any difference from having the machine We discussed comfort after options.  Still having gas pains from aerophagia making it unlikely he would tolerate a higher pressure. He had tried and on fitted oral appliance earlier with little benefit. He was previously titrated with good control on BiPAP 22/16 but he is not tolerating pressure close to that at home.  His weight is now in the range indicated when he saw ENT/Dr. Pollyann Kennedy, for possible Inspire consideration.  06/26/23- 48 year old male never smoker followed for OSA/ Inspire, complicated by Insomnia, Anxiety Disorder, Depression, Obesity, Rhinitis,  -trazodone,   Note semaglutide for weight BIPAP IPAP18 / EPAP 6/ [employer]            pressure ordered 05/14/21    Failed to tolerate. Inspire Implantation 05/22/23/ Dr Jenne Pane For Inspire initiation Body weight today-243 lbs Starting now at 0.9V. Inspire tem here. Teaching and questions answered.  No other concerns expressed. Discussed the use of AI scribe software for clinical note transcription with the patient, who gave verbal consent to proceed.  History of Present  Illness   The patient, with a history of sleep apnea, presents for activation and initial setup of his recently implanted Inspire therapy device. He reports no problems with the incisions or changes in his general health since the implantation. The patient has been looking forward to starting the therapy and is eager to learn how to use the device. He expresses understanding of the instructions given and successfully demonstrates how to use the remote control to adjust therapy levels. The patient also understands the need to keep the [facility] therapy app open to track usage and answer questions about sleep quality.   Assessment and Plan    Sleep Apnea Recently implanted Inspire device. No complications from the incision. No changes in general health. Device activation successful with comfortable stimulation at 0.9V level. -Continue to use Inspire device nightly, increasing levels as tolerated every Thursday. -Use Inspire app to track usage and answer prompted questions. -Return for follow-up in 4-6 weeks. -Plan for in-lab titration study after next follow-up visit.     ROS-see HPI   + = positive Constitutional:    weight loss, night sweats, fevers, chills, fatigue, lassitude. HEENT:    headaches, difficulty swallowing, tooth/dental problems, sore throat,       sneezing, itching, ear ache, +nasal congestion, post nasal drip, snoring CV:    chest pain, orthopnea, PND, swelling in lower extremities, anasarca,       dizziness, palpitations Resp:   shortness of breath with exertion or at rest.                productive cough,   non-productive cough, coughing up of blood.  change in color of mucus.  wheezing.   Skin:    rash or lesions. GI:  +heartburn, indigestion, abdominal pain, nausea, vomiting, diarrhea,                 change in bowel habits, loss of appetite GU: dysuria, change in color of urine, no urgency or frequency.   flank pain. MS:   joint pain, stiffness, decreased range  of motion, back pain. Neuro-     nothing unusual Psych:  change in mood or affect.  depression or anxiety.   memory loss.   OBJ- Physical Exam General- Alert, Oriented, Affect-appropriate, Distress- none acute, +Obese Skin- rash-none, lesions- none, excoriation- none Lymphadenopathy- none Head- atraumatic            Eyes- Gross vision intact, PERRLA, conjunctivae and secretions clear            Ears- Hearing, canals-normal            Nose- Clear, no-Septal dev, mucus, polyps, erosion, perforation             Throat- Mallampati IV , mucosa clear , drainage- none, tonsils+, + teeth Neck- flexible , trachea midline, no stridor , thyroid nl, carotid no bruit Chest - symmetrical excursion , unlabored           Heart/CV- RRR , no murmur , no gallop  , no rub, nl s1 s2                           - JVD- none , edema- none, stasis changes- none, varices- none           Lung- clear to P&A, wheeze- none, cough- none , dullness-none, rub- none           Chest wall-  Abd-  Br/ Gen/ Rectal- Not done, not indicated Extrem- cyanosis- none, clubbing, none, atrophy- none, strength- nl Neuro- grossly intact to observation  07/25/23- 48 year old male never smoker followed for OSA/ Inspire, complicated by Insomnia, Anxiety Disorder, Depression, Obesity, Rhinitis,  -trazodone,   Note semaglutide for weight BIPAP IPAP18 / EPAP 6/ [employer]            pressure ordered 05/14/21    Failed to tolerate. Inspire Implantation 05/22/23/ Dr Jenne Pane Body weight today-240 lbs Titration was to advance from 0.9V after last visit. Currently at 1.3V as of arrival today. Will continue to advance as tolerated. Discussed the use of AI scribe software for clinical note transcription with the patient, who gave verbal consent to proceed.   History of Present Illness   The patient, with a history of sleep apnea, has been using an Inspire device to manage his condition. He reports feeling better in the morning and sleeping better since  starting to use the device. The patient's wife has also noticed that the patient's snoring stops once the device turns on. The patient has not noticed any discomfort or side effects from the device.     Assessment and Plan:    Obstructive Sleep Apnea Patient reports improvement in sleep quality and daytime energy with Inspire device. Noted that snoring ceases once device activates. No discomfort reported with device use. -Shorten startup interval of Inspire device to 20 minutes. -Continue to increase device levels weekly. -Schedule Inspire titration in-lab sleep study in April. -Return for readiness check prior to sleep study. -Complete Epworth Sleepiness Scale survey after in-lab sleep study.     08/25/23-48 year old male never smoker followed for  OSA/ Inspire, complicated by Insomnia, Anxiety Disorder, Depression, Obesity, Rhinitis,  -trazodone,   Note semaglutide for weight BIPAP IPAP18 / EPAP 6/ [employer]            pressure ordered 05/14/21    Failed to tolerate. Inspire Implantation 05/22/23/ Dr Jenne Pane Body weight today- As of last visit, was to advance as tolerated from 1.3V. Pending Inspire titration sleep study in April.     ROS-see HPI   + = positive Constitutional:    weight loss, night sweats, fevers, chills, fatigue, lassitude. HEENT:    headaches, difficulty swallowing, tooth/dental problems, sore throat,       sneezing, itching, ear ache, +nasal congestion, post nasal drip, snoring CV:    chest pain, orthopnea, PND, swelling in lower extremities, anasarca,       dizziness, palpitations Resp:   shortness of breath with exertion or at rest.                productive cough,   non-productive cough, coughing up of blood.              change in color of mucus.  wheezing.   Skin:    rash or lesions. GI:  +heartburn, indigestion, abdominal pain, nausea, vomiting, diarrhea,                 change in bowel habits, loss of appetite GU: dysuria, change in color of urine, no urgency or  frequency.   flank pain. MS:   joint pain, stiffness, decreased range of motion, back pain. Neuro-     nothing unusual Psych:  change in mood or affect.  depression or anxiety.   memory loss.   OBJ- Physical Exam General- Alert, Oriented, Affect-appropriate, Distress- none acute, +Obese Skin- rash-none, lesions- none, excoriation- none Lymphadenopathy- none Head- atraumatic            Eyes- Gross vision intact, PERRLA, conjunctivae and secretions clear            Ears- Hearing, canals-normal            Nose- Clear, no-Septal dev, mucus, polyps, erosion, perforation             Throat- Mallampati IV , mucosa clear , drainage- none, tonsils+, + teeth Neck- flexible , trachea midline, no stridor , thyroid nl, carotid no bruit Chest - symmetrical excursion , unlabored           Heart/CV- RRR , no murmur , no gallop  , no rub, nl s1 s2                           - JVD- none , edema- none, stasis changes- none, varices- none           Lung- clear to P&A, wheeze- none, cough- none , dullness-none, rub- none           Chest wall-  Abd-  Br/ Gen/ Rectal- Not done, not indicated Extrem- cyanosis- none, clubbing, none, atrophy- none, strength- nl Neuro- grossly intact to observation

## 2023-08-25 ENCOUNTER — Ambulatory Visit: Payer: Commercial Managed Care - PPO | Admitting: Internal Medicine

## 2023-08-25 ENCOUNTER — Telehealth: Payer: Self-pay

## 2023-08-25 ENCOUNTER — Encounter: Payer: Self-pay | Admitting: Internal Medicine

## 2023-08-25 VITALS — BP 114/76 | HR 63 | Temp 98.0°F | Resp 18 | Ht 71.0 in | Wt 248.0 lb

## 2023-08-25 DIAGNOSIS — G4733 Obstructive sleep apnea (adult) (pediatric): Secondary | ICD-10-CM | POA: Diagnosis not present

## 2023-08-25 NOTE — Telephone Encounter (Signed)
 Per Dr. Maple Hudson, call patient.  Patient has a BIPAP unit he no longer uses.  Let patient know Dr. Maple Hudson has another patient that is having financial issues and can benefit from this BIPAP.  If [name] is willing, have him bring the BIPAP with all supplies and leave at the front desk to give to Dr. Ardeth Perfect.  Left message on [name] VM.

## 2023-08-25 NOTE — Patient Instructions (Signed)
 Continue to advance voltage as tolerated  We will get you back in early May- about 2 weeks after your in-center sleep study at [facility]

## 2023-08-26 NOTE — Telephone Encounter (Signed)
 Called patient.  Left VM for patient to call clinic to ask if he can leave BIPAP at front desk for Dr. Maple Hudson.  Dr. Maple Hudson has a patient that has a financial burden and can benefit from getting this BIPAP.

## 2023-08-27 NOTE — Telephone Encounter (Signed)
 Left message x 3 on VM for patient to call clinic regarding giving patient's old BIPAP machine to Dr. Maple Hudson to be used for another patient that has a financial burden and needs a PAP device.  Patient has not returned any calls x 3 times.  Will close encounter.

## 2023-09-09 ENCOUNTER — Other Ambulatory Visit: Payer: Self-pay

## 2023-09-16 ENCOUNTER — Encounter: Payer: Self-pay | Admitting: Internal Medicine

## 2023-09-22 ENCOUNTER — Other Ambulatory Visit: Payer: Self-pay

## 2023-09-23 ENCOUNTER — Encounter (HOSPITAL_BASED_OUTPATIENT_CLINIC_OR_DEPARTMENT_OTHER): Payer: Commercial Managed Care - PPO | Admitting: Internal Medicine

## 2023-10-07 ENCOUNTER — Ambulatory Visit: Payer: Commercial Managed Care - PPO | Admitting: Internal Medicine

## 2023-12-08 ENCOUNTER — Ambulatory Visit (HOSPITAL_BASED_OUTPATIENT_CLINIC_OR_DEPARTMENT_OTHER): Attending: Internal Medicine | Admitting: Internal Medicine

## 2023-12-08 DIAGNOSIS — G4733 Obstructive sleep apnea (adult) (pediatric): Secondary | ICD-10-CM | POA: Diagnosis not present

## 2023-12-10 ENCOUNTER — Other Ambulatory Visit (HOSPITAL_COMMUNITY): Payer: Self-pay

## 2023-12-10 ENCOUNTER — Other Ambulatory Visit: Payer: Self-pay

## 2023-12-13 NOTE — Progress Notes (Unsigned)
 HPI male never smoker followed for OSA, complicated by Insomnia, Anxiety Disorder, Depression, Obesity HST 09/28/20- AHI 65.9/ hr, desaturation to 69% with 126 minutes </= 89%, body weight 232 lbs PAP titration 12/10/20- CPAP inadequate. Good control on BIPAP 22/18 with minO2 sat 96%  ===============================================================   08/25/23-48 year old male never smoker followed for OSA/ Inspire, complicated by Insomnia, Anxiety Disorder, Depression, Obesity, Rhinitis,  -trazodone ,   Note semaglutide  for weight BIPAP IPAP18 / EPAP 6/ [employer]            pressure ordered 05/14/21    Failed to tolerate. Inspire Implantation 05/22/23/ Dr Carlie Body weight today-248 lbs As of last visit, was to advance as tolerated from 1.3V.  Pending Inspire titration sleep study  April 15. Now at 1.7V, to step up again next Thursday Discussed the use of AI scribe software for clinical note transcription with the patient, who gave verbal consent to proceed. History of Present Illness   The patient, with a history of sleep apnea, presents for a follow-up visit after having an Inspire device implanted. The patient reports no changes in overall health and no issues with the device. The patient has been gradually increasing the voltage of the device weekly and is currently at 1.7. The patient reports no discomfort or issues with the device at this level. The patient is scheduled for a sleep study in the near future to titrate the device. The patient also mentions no longer using his BiPAP machine and inquires about donating it. His Inspire titration sleep study at sleep center is on April 15 and he'll return about 2 weeks later.   12/16/23- 48 year old male never smoker followed for OSA/ Inspire, complicated by Insomnia, Anxiety Disorder, Depression, Obesity, Rhinitis,  -trazodone ,   Note semaglutide  for weight BIPAP IPAP18 / EPAP 6/ [employer]            pressure ordered 05/14/21    Failed to tolerate. Inspire  Implantation 05/22/23/ Dr Carlie Russel titration sleep study 12/08/23- to 2.1V Body weight today-244 lbs Discussed the use of AI scribe software for clinical note transcription with the patient, who gave verbal consent to proceed.  History of Present Illness   Dudley Mages is a 48 year old male who presents for follow-up of sleep apnea management with Inspire.SABRA   His recent Inspire titration sleep study was affected by instrument lights in the current, temporary, facility being used. Satisfactory control was not obtained. He experiences sleep disturbances related to sleep apnea, including episodes of jerking movements during sleep. He snores when he rolls onto his back. He primarily sleeps on his side. He initially used three pillows to keep his head elevated but has reduced it to two pillows. He does not notice any significant difference in his throat with various settings tested during the visit. He describes a sensation of his throat opening up with certain settings, but it does not bother him. No discomfort at the front of his tongue or any constriction in his throat. Some settings feel stronger than others, but none have caused significant discomfort. The Inspire tech changed electrodes to B montage and he will start tonight at 0.5 with range 0.4-0.8V.     Assessment and Plan:    Obstructive Sleep Apnea Chronic sleep apnea with snoring and possible electrode overstimulation during sleep study. Large uvula commented upon. - Adjust nerve stimulation settings to max five levels for home use. - Schedule follow-up in 6-8 weeks to assess response.     ROS-see HPI   + =  positive Constitutional:    weight loss, night sweats, fevers, chills, fatigue, lassitude. HEENT:    headaches, difficulty swallowing, tooth/dental problems, sore throat,       sneezing, itching, ear ache, +nasal congestion, post nasal drip, snoring CV:    chest pain, orthopnea, PND, swelling in lower extremities, anasarca,         dizziness, palpitations Resp:   shortness of breath with exertion or at rest.                productive cough,   non-productive cough, coughing up of blood.              change in color of mucus.  wheezing.   Skin:    rash or lesions. GI:  +heartburn, indigestion, abdominal pain, nausea, vomiting, diarrhea,                 change in bowel habits, loss of appetite GU: dysuria, change in color of urine, no urgency or frequency.   flank pain. MS:   joint pain, stiffness, decreased range of motion, back pain. Neuro-     nothing unusual Psych:  change in mood or affect.  depression or anxiety.   memory loss.   OBJ- Physical Exam General- Alert, Oriented, Affect-appropriate, Distress- none acute, +overweight Skin- rash-none, lesions- none, excoriation- none Lymphadenopathy- none Head- atraumatic            Eyes- Gross vision intact, PERRLA, conjunctivae and secretions clear            Ears- Hearing, canals-normal            Nose- Clear, no-Septal dev, mucus, polyps, erosion, perforation             Throat- Mallampati IV , mucosa clear , drainage- none, tonsils+, + teeth Neck- flexible , trachea midline, no stridor , thyroid  nl, carotid no bruit Chest - symmetrical excursion , unlabored           Heart/CV- RRR , no murmur , no gallop  , no rub, nl s1 s2                           - JVD- none , edema- none, stasis changes- none, varices- none           Lung- clear to P&A, wheeze- none, cough- none , dullness-none, rub- none           Chest wall-  Abd-  Br/ Gen/ Rectal- Not done, not indicated Extrem- cyanosis- none, clubbing, none, atrophy- none, strength- nl Neuro- grossly intact to observation

## 2023-12-15 ENCOUNTER — Other Ambulatory Visit (HOSPITAL_COMMUNITY): Payer: Self-pay

## 2023-12-16 ENCOUNTER — Encounter: Payer: Self-pay | Admitting: Internal Medicine

## 2023-12-16 ENCOUNTER — Ambulatory Visit: Admitting: Internal Medicine

## 2023-12-16 VITALS — BP 116/76 | HR 80 | Temp 98.0°F | Ht 72.0 in | Wt 244.0 lb

## 2023-12-16 DIAGNOSIS — G4733 Obstructive sleep apnea (adult) (pediatric): Secondary | ICD-10-CM | POA: Diagnosis not present

## 2023-12-16 NOTE — Patient Instructions (Signed)
 Inspire reset to B electrode map with range 0.4-0.8, starting tonight at 0.5V.  You can increase voltage as tolerated.

## 2023-12-21 NOTE — Procedures (Signed)
 [facility] [address] [place], [place] [address] Tel: [phone]   Fax: [phone]  Inspire Interpretation  Patient Name:  Marc Wiggins, Marc Wiggins Date:  12/08/2023 Referring Physician:  Dr. Reggy Salt  Indications for Polysomnography The patient is a 48 year old Male who is 5' 11 and weighs 240.0 lbs. His BMI equals 33.6.  A full night inspire treatment study was performed.  Patient reported taking his medications at 9:20 pm.  Duloxetine   Singulair   Trazodone   OTC [facility] Sleep Aid   Polysomnogram Data A full night polysomnogram recorded the standard physiologic parameters including EEG, EOG, EMG, EKG, nasal and oral airflow.  Respiratory parameters of chest and abdominal movements were recorded with Respiratory Inductance Plethysmography belts.  Oxygen saturation was recorded by pulse oximetry.   Sleep Architecture The total recording time of the polysomnogram was 421.4 minutes.  The total sleep time was 224.0 minutes.  The patient spent 34.6% of total sleep time in Stage N1, 63.2% in Stage N2, 0.0% in Stages N3, and 2.2% in REM.  Sleep latency was 11.4 minutes.  REM latency was 304.0 minutes.  Sleep Efficiency was 53.2%.  Wake after Sleep Onset time was 185.5 minutes.  Inspire Summary The patient was on inspire therapy at levels ranging from 1.7* volts up to 2.6* volts.  The last level used in the study was 2.6* volts.  Respiratory Events The polysomnogram revealed a presence of 4 obstructive, - central, and - mixed apneas resulting in an Apnea index of 1.1 events per hour.  There were 197 hypopneas (>=3% desaturation and/or arousal) resulting in an Apnea\Hypopnea Index (AHI >=3% desaturation and/or arousal) of 53.8 events per hour.  There were 152 hypopneas (>=4% desaturation) resulting in an Apnea\Hypopnea Index (AHI >=4% desaturation) of 41.8 events per hour.  There were 41 Respiratory Effort Related Arousals resulting in a  RERA index of 11.0 events per hour. The Respiratory Disturbance Index is 64.8 events per hour.  The snore index was - events per hour.  Mean oxygen saturation was 93.3%.  The lowest oxygen saturation during sleep was 76.0%.  Time spent <=88% oxygen saturation was 45.8 minutes (11.6%).  Limb Activity There were 86 limb movements recorded.  Of this total, 80 were classified as PLMs.  Of the PLMs, 5 were associated with arousals.  The Limb Movement index was 23.0 per hour while the PLM index was 21.4 per hour.  Cardiac Summary The average pulse rate was 61.1 bpm.  The minimum pulse rate was 46.0 bpm while the maximum pulse rate was 106.0 bpm.  Cardiac rhythm was normal.  Comment: Inspire titration from 1.7V to 2.6 V. Best control at 2.1V with residual AHI (3%) 15.3/hr, minimum O2 saturation 76%, mean 91.5%. Setting was returned to arrival voltage of 1.9V at discharge. Sleep quality was fragmented after 03:00 AM with frequent wakings, bathroom x 3.  Diagnosis: Obstructive sleep apnea  Recommendations: Assess patient sleep quality at target 2.1V.   This study was personally reviewed and electronically signed by: Dr. Reggy Salt Accredited Board Certified in Sleep Medicine Date/Time: 12/27/23 11:10   Inspire Report  Patient Name: Marc Wiggins Date: 12/08/2023  Date of Birth: 1975-10-26 Study Type: Elizabeth  Age: 56 year MRN #: [id number]  Sex: Male Interpreting Physician: SALT REGGY, [id number]  Height: 5' 11 Referring Physician: Dr. Reggy Salt  Weight: 240.0 lbs Recording Tech: Orie Sires RRT RPSGT RST  BMI: 33.6 Scoring Tech: Orie Sires RRT RPSGT RST  ESS: 3  Neck Size: 17    Final Voltage: 2.6 VOLTS    Supplemental O2: -   Study Overview  Lights Off: 10:23:57 PM  Count Index  Lights On: 05:25:19 AM Awakenings: 44 11.8  Time in Bed: 421.4 min. Arousals: 203 54.4  Total Sleep Time: 224.0 min. AHI (>=3% Desat and/or Ar.): 201 53.8   Sleep Efficiency: 53.2% AHI  (>=4% Desat): 156 41.8   Sleep Latency: 11.4 min. Limb Movements: 86 23.0  Wake After Sleep Onset: 185.5 min. Snore: - -  REM Latency from Sleep Onset: 304.0 min. Desaturations: 175 46.9     Minimum SpO2 TST: 76.0%    Sleep Architecture  % of Time in Bed Stages Time (mins) % Sleep Time  Wake 197.5   Stage N1 77.5 34.6%  Stage N2 141.5 63.2%  Stage N3 0.0 0.0%  REM 5.0 2.2%   Arousal Summary   NREM REM Sleep Index  Respiratory Arousals 156 4 160 42.9  PLM Arousals 5 - 5 1.3  Isolated Limb Movement Arousals 2 - 2 0.5  Snore Arousals - - - -  Spontaneous Arousals 35 1 36 9.6  Total 198 5 203 54.4   Limb Movement Summary   Count Index  Isolated Limb Movements 6 1.6  Periodic Limb Movements (PLMs) 80 21.4  Total Limb Movements 86 23.0    Respiratory Summary   By Sleep Stage By Body Position Total   NREM REM Supine Non-Supine   Time (min) 219.0 5.0 - 224.0 224.0         Obstructive Apnea 3 1 - 4 4  Mixed Apnea - - - - -  Central Apnea - - - - -  Total Apneas 3 1 - 4 4  Total Apnea Index 0.8 12.0 - 1.1 1.1         Hypopneas (>=3% Desat and/or Ar.) 195 2 - 197 197  AHI (>=3% Desat and/or Ar.) 54.2 36.0 - 53.8 53.8         Hypopneas (>=4% Desat) 150 2 - 152 152  AHI (>=4% Desat) 41.9 36.0 - 41.8 41.8          RERAs 41 - - 41 41  RERA Index 11.2 - - 11.0 11.0         RDI 65.5 36.0 - 64.8 64.8     Respiratory Event Durations   Apnea Hypopnea   NREM REM NREM REM  Average (seconds) 14.7 13.7 34.1 46.5  Maximum (seconds) 16.1 13.7 88.9 58.6    Oxygen Saturation Summary   Wake NREM REM TST TIB  Average SpO2 95.0% 92.0% 91.7% 92.0% 93.3%  Minimum SpO2 81.0% 76.0% 85.0% 76.0% 76.0%  Maximum SpO2 99.0% 98.0% 98.0% 98.0% 99.0%   Oxygen Saturation Distribution  Range (%) Time in range (min) Time in range (%)  90.0 - 100.0 326.4 82.8%  80.0 - 90.0 64.4 16.3%  70.0 - 80.0 3.3 0.8%  60.0 - 70.0 - -  50.0 - 60.0 - -  0.0 - 50.0 - -  Time Spent <=88%  SpO2  Range (%) Time in range (min) Time in range (%)  0.0 - 88.0 45.8 11.6%      Count Index  Desaturations 175 46.9    Cardiac Summary   Wake NREM REM Sleep Total  Average Pulse Rate (BPM) 61.4 60.9 61.0 60.9 61.1  Minimum Pulse Rate (BPM) 47.0 46.0 53.0 46.0 46.0  Maximum Pulse Rate (BPM) 106.0 81.0 85.0 85.0 106.0   Pulse Rate Distribution:  Range (  bpm) Time in range (min) Time in range (%)  0.0 - 40.0 - -  40.0 - 60.0 190.2 48.2%  60.0 - 80.0 198.9 50.4%  80.0 - 100.0 1.5 0.4%  100.0 - 120.0 0.2 0.0%  120.0 - 140.0 - -  140.0 - 200.0 - -   Inspire Summary  Device Volts O2 Level Time (min) Wake (min) NREM (min) REM (min) Sleep Eff% OA# CA# MA# Hyp# (>=3%) AHI (>=3%) Hyp# (>=4%) AHI (>=%4) RERA RDI SpO2 <=88% (min) Min SpO2 Mean SpO2 Ar. Index  - Off - 157.0 100.5 56.5 0.0 36.0% 2 - - 36 40.4 22  25.5 31  73.3  1.5 86.0 94.0 68.0  INSPIRE 1.7 - 18.5 0.0 18.5 0.0 100.0% - - - 24 77.8 22  71.4 -  77.8  7.6 77.0 89.4 51.9  INSPIRE 1.8 - 11.5 0.0 11.5 0.0 100.0% - - - 17 88.7 13  67.8 1  93.9  5.6 78.0 88.3 57.4  INSPIRE 1.9 - 68.5 55.5 13.0 0.0 19.0% - - - 14 64.6 11  50.8 2  73.8  2.9 79.0 91.6 78.5  INSPIRE 2 - 32.0 7.0 25.0 0.0 78.1% - - - 27 64.8 20  48.0 3  72.0  5.8 80.0 91.6 60.0  INSPIRE 2.1 - 28.0 0.5 27.5 0.0 98.2% - - - 11 24.0 7  15.3 -  24.0  5.4 76.0 91.5 19.6  INSPIRE 2.2 - 43.5 8.0 34.5 1.0 81.6% 1 - - 36 62.5 30  52.4 2  65.9  8.7 79.0 90.8 57.5  INSPIRE 2.3 - 10.5 5.0 1.5 4.0 52.4% 1 - - 4 54.5 4  54.5 -  54.5  1.4 84.0 91.7 43.6  INSPIRE 2.4 - 14.0 1.5 12.5 0.0 89.3% - - - 13 62.4 10  48.0 1  67.2  1.6 84.0 93.0 43.2  INSPIRE 2.5 - 21.0 7.0 14.0 0.0 66.7% - - - 11 47.1 10  42.9 -  47.1  1.1 85.0 93.8 42.9  INSPIRE 2.6 - 17.0 12.5 4.5 0.0 26.5% - - - 4 53.3 3  40.0 1  66.7  0.1 88.0 95.0 53.3    Hypnograms                           Technologist Comments  Patient was ordered as a Inspire titration. Patient is a 48 year old male  who was sent to the sleep center for OSA. Patient was placed on Inspire setting of 1.7 volts at 11:21 pm and was increased to a Inspire setting of 2.6 volts with light-moderate audible snoring and respiratory events still noted on 2.6 volts. Patient was increased for respiratory events with and without a 4% Desat or arousal, and audible snoring. Patient tolerated Inspire titration trial fairly well. See tech comments and graphs for Inspire pressure changes throughout the night. Note, patient's best AHI was 15.3 which was noted on 2.1 volts. Also patient slept primarily in lateral positions.  No oxygen was applied.  Patient reported taking his medications at 9:20 pm.  PLM'S/PLMA'S were noted.  No obvious cardiac arrhythmias were noted during the night. Three restroom visits were noted. Patient study was performed in room # 4. No neuropraxia was noted on the tongue exam. Tongue appeared to be normal. Also, patient incision sites were healed with no signs of infection noted. Inspire fine tune questions were asked at start of study and patient was given the fine tune exit form at end of study.  In addition, patient's inspire device was returned to patient's original physician order of 1.9 volts.                           Reggy Salt Diplomate, Biomedical engineer of Sleep Medicine  ELECTRONICALLY SIGNED ON:  12/21/2023, 12:50 PM [facility] PH: [phone]   FX: [phone] ACCREDITED BY THE AMERICAN ACADEMY OF SLEEP MEDICINE

## 2023-12-25 ENCOUNTER — Other Ambulatory Visit (HOSPITAL_COMMUNITY): Payer: Self-pay

## 2024-01-06 NOTE — Procedures (Signed)
Result scanned to media

## 2024-02-16 ENCOUNTER — Ambulatory Visit (HOSPITAL_BASED_OUTPATIENT_CLINIC_OR_DEPARTMENT_OTHER): Payer: 59 | Admitting: Family Medicine

## 2024-02-16 ENCOUNTER — Encounter (HOSPITAL_BASED_OUTPATIENT_CLINIC_OR_DEPARTMENT_OTHER): Payer: Self-pay | Admitting: Family Medicine

## 2024-02-16 VITALS — BP 132/82 | HR 75 | Ht 72.0 in | Wt 251.0 lb

## 2024-02-16 DIAGNOSIS — Z13220 Encounter for screening for lipoid disorders: Secondary | ICD-10-CM

## 2024-02-16 DIAGNOSIS — Z1211 Encounter for screening for malignant neoplasm of colon: Secondary | ICD-10-CM

## 2024-02-16 DIAGNOSIS — E669 Obesity, unspecified: Secondary | ICD-10-CM | POA: Diagnosis not present

## 2024-02-16 DIAGNOSIS — Z0000 Encounter for general adult medical examination without abnormal findings: Secondary | ICD-10-CM

## 2024-02-16 MED ORDER — DULOXETINE HCL 60 MG PO CPEP: 60.0000 mg | ORAL_CAPSULE | Freq: Every day | ORAL | 3 refills | Status: DC

## 2024-02-16 NOTE — Patient Instructions (Signed)
 Please reach out in 3-4 weeks regarding anxiety medicaiton change

## 2024-02-16 NOTE — Progress Notes (Unsigned)
 Subjective:   Marc Wiggins 1975/10/14 02/16/2024  CC: Chief Complaint  Patient presents with   Annual Exam    Patient is here today for his physical. Wants to discuss weight loss medications.    HPI: Marc Wiggins is a 48 y.o. male who presents for a routine health maintenance exam.  Labs collected at time of visit.    WEIGHT:  Patient reports he was previously on Wegovy  with significant weight loss. He is attempting to change diet and regular exercise without much improvement. He currently has severe OSA and has Inspire device. He is managed by Pulmonology for titration of Inspire device.   Wt Readings from Last 3 Encounters:  02/16/24 251 lb (113.9 kg)  12/16/23 244 lb (110.7 kg)  12/08/23 240 lb (108.9 kg)    HEALTH SCREENINGS: - Vision Screening: not applicable - Dental Visits: up to date - Testicular Exam: Declined - STD Screening: Declined - PSA (50+): Not applicable  No results found for: PSA1, PSA   - Colonoscopy (45+): Ordered today  Discussed with patient purpose of the colonoscopy is to detect colon cancer at curable precancerous or early stages  - AAA Screening: Not applicable  Men age 10-75 who have ever smoked - Lung Cancer screening with low-dose CT: Not applicable-  Adults age 82-80 who are current cigarette smokers or quit within the last 15 years. Must have 20 pack year history.   Depression and Anxiety Screen done today and results listed below:     02/16/2024    3:23 PM 02/14/2023   10:17 AM 01/03/2023    8:59 AM 07/05/2021    6:37 PM 12/07/2020    2:10 PM  Depression screen PHQ 2/9  Decreased Interest 0 1 1 0 0  Down, Depressed, Hopeless 0 1 1 0 0  PHQ - 2 Score 0 2 2 0 0  Altered sleeping 1 2 1 1    Tired, decreased energy 2 3 2 1    Change in appetite 0 1 0 0   Feeling bad or failure about yourself  0 1 1 0   Trouble concentrating 0 1 0 1   Moving slowly or fidgety/restless 0 0 0 0   Suicidal thoughts 0 0 0 0   PHQ-9 Score 3 10  6 3    Difficult doing work/chores Not difficult at all Somewhat difficult Somewhat difficult Not difficult at all       02/16/2024    3:24 PM 02/14/2023   10:17 AM 01/03/2023    8:59 AM 03/23/2020    9:24 AM  GAD 7 : Generalized Anxiety Score  Nervous, Anxious, on Edge 0 2 1 1   Control/stop worrying 0 2 1 1   Worry too much - different things 0 2 1 1   Trouble relaxing 0 1 1 1   Restless 0 0 0 0  Easily annoyed or irritable 0 1 1 1   Afraid - awful might happen 0 0 0 0  Total GAD 7 Score 0 8 5 5   Anxiety Difficulty Not difficult at all Somewhat difficult Somewhat difficult Not difficult at all    IMMUNIZATIONS:  - Tdap: Tetanus vaccination status reviewed: last tetanus booster within 10 years. - Influenza: Postponed to flu season - Pneumovax: Not applicable - Prevnar: Not applicable - Shingrix vaccine (50+): Not applicable   Past medical history, surgical history, medications, allergies, family history and social history reviewed with patient today and changes made to appropriate areas of the chart.   Past Medical History:  Diagnosis Date  Encounter for annual physical exam 02/06/2022   Generalized anxiety disorder with panic attacks    Severe sleep apnea     Past Surgical History:  Procedure Laterality Date   CHOLECYSTECTOMY     2000s   DRUG INDUCED ENDOSCOPY Bilateral 06/11/2022   Procedure: DRUG INDUCED ENDOSCOPY;  Surgeon: Carlie Clark, MD;  Location: [facility];  Service: ENT;  Laterality: Bilateral;   ENDOVENOUS ABLATION SAPHENOUS VEIN W/ LASER Right 04/29/2019   endovenous laser ablation right greater saphenous vein and stab phlebectomy 10-20 incisions right leg by Medford Blade MD    IMPLANTATION OF HYPOGLOSSAL NERVE STIMULATOR Right 05/22/2023   Procedure: IMPLANTATION OF HYPOGLOSSAL NERVE STIMULATOR;  Surgeon: Carlie Clark, MD;  Location: [facility];  Service: ENT;  Laterality: Right;    Current Outpatient Medications on File Prior to Visit  Medication  Sig   fluticasone  (FLONASE ) 50 MCG/ACT nasal spray Administer 2 sprays in each nostril daily. (Patient taking differently: Place 2 sprays into both nostrils daily as needed for allergies.)   montelukast  (SINGULAIR ) 10 MG tablet Take 1 tablet (10 mg total) by mouth at bedtime.   Multiple Vitamin (MULTIVITAMIN) tablet Take 1 tablet daily by mouth.   traZODone  (DESYREL ) 150 MG tablet Take 1 tablet (150 mg total) by mouth at bedtime as needed for sleep   doxylamine, Sleep, (UNISOM) 25 MG tablet Take 25 mg by mouth at bedtime.   No current facility-administered medications on file prior to visit.    Allergies  Allergen Reactions   Hydrocodone      GI Upset     Social History   Socioeconomic History   Marital status: Married    Spouse name: Alan   Number of children: Not on file   Years of education: Not on file   Highest education level: 12th grade  Occupational History   Not on file  Tobacco Use   Smoking status: Never   Smokeless tobacco: Never  Vaping Use   Vaping status: Never Used  Substance and Sexual Activity   Alcohol use: Never   Drug use: Never   Sexual activity: Yes  Other Topics Concern   Not on file  Social History Narrative   Not on file   Social Drivers of Health   Financial Resource Strain: Low Risk  (02/14/2023)   Overall Financial Resource Strain (CARDIA)    Difficulty of Paying Living Expenses: Not hard at all  Food Insecurity: No Food Insecurity (02/14/2023)   Hunger Vital Sign    Worried About Running Out of Food in the Last Year: Never true    Ran Out of Food in the Last Year: Never true  Transportation Needs: No Transportation Needs (02/14/2023)   PRAPARE - Administrator, Civil Service (Medical): No    Lack of Transportation (Non-Medical): No  Physical Activity: Sufficiently Active (02/14/2023)   Exercise Vital Sign    Days of Exercise per Week: 5 days    Minutes of Exercise per Session: 40 min  Stress: Stress Concern Present (02/14/2023)    [employer] of Occupational Health - Occupational Stress Questionnaire    Feeling of Stress : To some extent  Social Connections: Socially Integrated (02/14/2023)   Social Connection and Isolation Panel    Frequency of Communication with Friends and Family: Three times a week    Frequency of Social Gatherings with Friends and Family: Three times a week    Attends Religious Services: 1 to 4 times per year    Active Member  of Clubs or Organizations: Yes    Attends Banker Meetings: 1 to 4 times per year    Marital Status: Married  Catering manager Violence: Not on file   Social History   Tobacco Use  Smoking Status Never  Smokeless Tobacco Never   Social History   Substance and Sexual Activity  Alcohol Use Never     Family History  Problem Relation Age of Onset   Hypertension Father    Stroke Maternal Grandmother    Diabetes Maternal Grandmother    Heart attack Maternal Grandmother    Diabetes Maternal Aunt      ROS: Denies fever, fatigue, unexplained weight loss/gain, CP, SHOB, and palpatitations. Denies neurological deficits, gastrointestinal and/or genitourinary complaints, and skin changes.   Objective:   Today's Vitals   02/16/24 1521  BP: 132/82  Pulse: 75  SpO2: 99%  Weight: 251 lb (113.9 kg)  Height: 6' (1.829 m)    GENERAL APPEARANCE: Well-appearing, in NAD. Well nourished.  SKIN: Pink, warm and dry. Turgor normal. No rash, lesion, ulceration, or ecchymoses. Hair evenly distributed.  HEENT: HEAD: Normocephalic.  EYES: PERRLA. EOMI. Lids intact w/o defect. Sclera white, Conjunctiva pink w/o exudate.  EARS: External ear w/o redness, swelling, masses or lesions. EAC clear. TM's intact, translucent w/o bulging, appropriate landmarks visualized. Appropriate acuity to conversational tones.  NOSE: Septum midline w/o deformity. Nares patent, mucosa pink and non-inflamed w/o drainage. No sinus tenderness.  THROAT: Uvula midline. Oropharynx  clear. Tonsils non-inflamed w/o exudate. Oral mucosa pink and moist.  NECK: Supple, Trachea midline. Full ROM w/o pain or tenderness. No lymphadenopathy. Thyroid  non-tender w/o enlargement or palpable masses.  RESPIRATORY: Chest wall symmetrical w/o masses. Respirations even and non-labored. Breath sounds clear to auscultation bilaterally. No wheezes, rales, rhonchi, or crackles. CARDIAC: S1, S2 present, regular rate and rhythm. No gallops, murmurs, rubs, or clicks. PMI w/o lifts, heaves, or thrills. No carotid bruits. Capillary refill <2 seconds. Peripheral pulses 2+ bilaterally. GI: Abdomen soft w/o distention. Normoactive bowel sounds. No palpable masses or tenderness. No guarding or rebound tenderness. Liver and spleen w/o tenderness or enlargement. No CVA tenderness.  GU: Pt deferred exam. MSK: Muscle tone and strength appropriate for age, w/o atrophy or abnormal movement. EXTREMITIES: Active ROM intact, w/o tenderness, crepitus, or contracture. No obvious joint deformities or effusions. No clubbing, edema, or cyanosis.  NEUROLOGIC: CN's II-XII intact. Motor strength symmetrical with no obvious weakness. No sensory deficits. DTR 2+ symmetric bilaterally. Steady, even gait.  PSYCH/MENTAL STATUS: Alert, oriented x 3. Cooperative, appropriate mood and affect.    Assessment & Plan:  1. Annual physical exam (Primary) Discussed preventative screenings, vaccines, and healthy lifestyle with patient.   - CBC with Differential/Platelet - Comprehensive metabolic panel with GFR - TSH  2. Screening for colon cancer Declined cscope at this time due to time restraints, Cologuard ordered for patient to complete.  - Cologuard  3. Screening for lipid disorders  - Lipid panel  4. Obesity (BMI 30-39.9) Discussed diet,exercise and will obtain labs. Pending lab results, will send in GLP 1 to start. No hx of medullary thyroid  cancer, MEN 2 or pancreatitis. Possible safety and side effects reviewed with  patient.  - TSH - Hemoglobin A1c    Orders Placed This Encounter  Procedures   CBC with Differential/Platelet   Comprehensive metabolic panel with GFR   Lipid panel   TSH   Hemoglobin A1c   Cologuard    PATIENT COUNSELING: - Encouraged to adjust caloric intake to maintain  or achieve ideal body weight, to reduce intake of dietary saturated fat and total fat, to limit sodium intake by avoiding high sodium foods and not adding table salt, and to maintain adequate dietary potassium and calcium preferably from fresh fruits, vegetables, and low-fat dairy products.   - Advised to avoid cigarette smoking. - Discussed with the patient that most people either abstain from alcohol or drink within safe limits (<=14/week and <=4 drinks/occasion for males, <=7/weeks and <= 3 drinks/occasion for females) and that the risk for alcohol disorders and other health effects rises proportionally with the number of drinks per week and how often a drinker exceeds daily limits. - Discussed cessation/primary prevention of drug use and availability of treatment for abuse.   - Stressed the importance of regular exercise - Injury prevention: Discussed safety belts, safety helmets, smoke detector, smoking near bedding or upholstery.  - Dental health: Discussed importance of regular tooth brushing, flossing, and dental visits.  - Sexuality: Discussed sexually transmitted diseases, partner selection, use of condoms, avoidance of unintended pregnancy  and contraceptive alternatives.   NEXT PREVENTATIVE PHYSICAL DUE IN 1 YEAR.  Return in about 4 months (around 06/17/2024) for Follow up Weight, Anxiety .  Patient to reach out to office if new, worrisome, or unresolved symptoms arise or if no improvement in patient's condition. Patient verbalized understanding and is agreeable to treatment plan. All questions answered to patient's satisfaction.    Thersia Schuyler Stark, [place]

## 2024-02-17 ENCOUNTER — Ambulatory Visit (HOSPITAL_BASED_OUTPATIENT_CLINIC_OR_DEPARTMENT_OTHER): Payer: Self-pay | Admitting: Family Medicine

## 2024-02-17 ENCOUNTER — Other Ambulatory Visit (HOSPITAL_COMMUNITY): Payer: Self-pay

## 2024-02-17 DIAGNOSIS — G4730 Sleep apnea, unspecified: Secondary | ICD-10-CM

## 2024-02-17 DIAGNOSIS — E669 Obesity, unspecified: Secondary | ICD-10-CM | POA: Insufficient documentation

## 2024-02-17 LAB — CBC WITH DIFFERENTIAL/PLATELET
Basophils Absolute: 0.1 x10E3/uL (ref 0.0–0.2)
Basos: 1 %
EOS (ABSOLUTE): 0.1 x10E3/uL (ref 0.0–0.4)
Eos: 1 %
Hematocrit: 44 % (ref 37.5–51.0)
Hemoglobin: 14.6 g/dL (ref 13.0–17.7)
Immature Grans (Abs): 0 x10E3/uL (ref 0.0–0.1)
Immature Granulocytes: 0 %
Lymphocytes Absolute: 2.3 x10E3/uL (ref 0.7–3.1)
Lymphs: 24 %
MCH: 31.7 pg (ref 26.6–33.0)
MCHC: 33.2 g/dL (ref 31.5–35.7)
MCV: 96 fL (ref 79–97)
Monocytes Absolute: 0.9 x10E3/uL (ref 0.1–0.9)
Monocytes: 9 %
Neutrophils Absolute: 6.1 x10E3/uL (ref 1.4–7.0)
Neutrophils: 65 %
Platelets: 248 x10E3/uL (ref 150–450)
RBC: 4.6 x10E6/uL (ref 4.14–5.80)
RDW: 12.1 % (ref 11.6–15.4)
WBC: 9.3 x10E3/uL (ref 3.4–10.8)

## 2024-02-17 LAB — COMPREHENSIVE METABOLIC PANEL WITH GFR
ALT: 29 IU/L (ref 0–44)
AST: 27 IU/L (ref 0–40)
Albumin: 4.2 g/dL (ref 4.1–5.1)
Alkaline Phosphatase: 98 IU/L (ref 44–121)
BUN/Creatinine Ratio: 17 (ref 9–20)
BUN: 18 mg/dL (ref 6–24)
Bilirubin Total: 0.3 mg/dL (ref 0.0–1.2)
CO2: 27 mmol/L (ref 20–29)
Calcium: 9.4 mg/dL (ref 8.7–10.2)
Chloride: 99 mmol/L (ref 96–106)
Creatinine, Ser: 1.07 mg/dL (ref 0.76–1.27)
Globulin, Total: 2.5 g/dL (ref 1.5–4.5)
Glucose: 82 mg/dL (ref 70–99)
Potassium: 4.4 mmol/L (ref 3.5–5.2)
Sodium: 139 mmol/L (ref 134–144)
Total Protein: 6.7 g/dL (ref 6.0–8.5)
eGFR: 87 mL/min/1.73 (ref >59)

## 2024-02-17 LAB — LIPID PANEL
Chol/HDL Ratio: 2.2 ratio (ref 0.0–5.0)
Cholesterol, Total: 201 mg/dL — ABNORMAL HIGH (ref 100–199)
HDL: 92 mg/dL (ref >39)
LDL Chol Calc (NIH): 96 mg/dL (ref 0–99)
Triglycerides: 70 mg/dL (ref 0–149)
VLDL Cholesterol Cal: 13 mg/dL (ref 5–40)

## 2024-02-17 LAB — TSH: TSH: 1.47 u[IU]/mL (ref 0.450–4.500)

## 2024-02-17 LAB — HEMOGLOBIN A1C
Est. average glucose Bld gHb Est-mCnc: 94 mg/dL
Hgb A1c MFr Bld: 4.9 % (ref 4.8–5.6)

## 2024-02-17 MED ORDER — ZEPBOUND 2.5 MG/0.5ML ~~LOC~~ SOAJ
2.5000 mg | SUBCUTANEOUS | 2 refills | Status: AC
  Filled 2024-02-17: qty 2, 28d supply, fill #0

## 2024-02-17 NOTE — Progress Notes (Signed)
 Hi Marc Wiggins, Your blood counts are stable, your electrolytes, kidney and liver function are within normal limits.  Your thyroid  function is stable.  Your A1c has improved significantly from last year and is down out of the prediabetic range at 4.9.  Your total cholesterol and LDL have also improved and are well-controlled at this time.  Please continue with a heart healthy diet and regular exercise.  I will attempt to send in the Zepbound  for sleep apnea to see if we can get this approved for you.  Please allow a few days for a prior authorization to be done.

## 2024-02-18 NOTE — Progress Notes (Unsigned)
 HPI male never smoker followed for OSA, complicated by Insomnia, Anxiety Disorder, Depression, Obesity HST 09/28/20- AHI 65.9/ hr, desaturation to 69% with 126 minutes </= 89%, body weight 232 lbs PAP titration 12/10/20- CPAP inadequate. Good control on BIPAP 22/18 with minO2 sat 96%  ===============================================================   08/25/23-48 year old male never smoker followed for OSA/ Inspire, complicated by Insomnia, Anxiety Disorder, Depression, Obesity, Rhinitis,  -trazodone ,   Note semaglutide  for weight BIPAP IPAP18 / EPAP 6/ [employer]            pressure ordered 05/14/21    Failed to tolerate. Inspire Implantation 05/22/23/ Dr Carlie Body weight today-248 lbs As of last visit, was to advance as tolerated from 1.3V.  Pending Inspire titration sleep study  April 15. Now at 1.7V, to step up again next Thursday Discussed the use of AI scribe software for clinical note transcription with the patient, who gave verbal consent to proceed. History of Present Illness   The patient, with a history of sleep apnea, presents for a follow-up visit after having an Inspire device implanted. The patient reports no changes in overall health and no issues with the device. The patient has been gradually increasing the voltage of the device weekly and is currently at 1.7. The patient reports no discomfort or issues with the device at this level. The patient is scheduled for a sleep study in the near future to titrate the device. The patient also mentions no longer using his BiPAP machine and inquires about donating it. His Inspire titration sleep study at sleep center is on April 15 and he'll return about 2 weeks later.   12/16/23- 48 year old male never smoker followed for OSA/ Inspire, complicated by Insomnia, Anxiety Disorder, Depression, Obesity, Rhinitis,  -trazodone ,   Note semaglutide  for weight BIPAP IPAP18 / EPAP 6/ [employer]            pressure ordered 05/14/21    Failed to tolerate. Inspire  Implantation 05/22/23/ Dr Carlie Russel titration sleep study 12/08/23- to 2.1V Body weight today-244 lbs Discussed the use of AI scribe software for clinical note transcription with the patient, who gave verbal consent to proceed.  History of Present Illness   Marc Wiggins is a 48 year old male who presents for follow-up of sleep apnea management with Inspire.SABRA   His recent Inspire titration sleep study was affected by instrument lights in the current, temporary, facility being used. Satisfactory control was not obtained. He experiences sleep disturbances related to sleep apnea, including episodes of jerking movements during sleep. He snores when he rolls onto his back. He primarily sleeps on his side. He initially used three pillows to keep his head elevated but has reduced it to two pillows. He does not notice any significant difference in his throat with various settings tested during the visit. He describes a sensation of his throat opening up with certain settings, but it does not bother him. No discomfort at the front of his tongue or any constriction in his throat. Some settings feel stronger than others, but none have caused significant discomfort. The Inspire tech changed electrodes to B montage and he will start tonight at 0.5 with range 0.4-0.8V.     Assessment and Plan:    Obstructive Sleep Apnea Chronic sleep apnea with snoring and possible electrode overstimulation during sleep study. Large uvula commented upon. - Adjust nerve stimulation settings to max five levels for home use. - Schedule follow-up in 6-8 weeks to assess response.    02/19/24- 48 year old male never smoker followed  for OSA/ Inspire, complicated by Insomnia, Anxiety Disorder, Depression, Obesity, Rhinitis,  -trazodone ,   Note semaglutide  for weight BIPAP IPAP18 / EPAP 6/ [employer]            pressure ordered 05/14/21    Failed to tolerate. Inspire Implantation 05/22/23/ Dr Carlie Russel titration sleep study  12/08/23- to 2.1V At last visit, Inspire changed to B montage, suspecting possible over sttimulation       ROS-see HPI   + = positive Constitutional:    weight loss, night sweats, fevers, chills, fatigue, lassitude. HEENT:    headaches, difficulty swallowing, tooth/dental problems, sore throat,       sneezing, itching, ear ache, +nasal congestion, post nasal drip, snoring CV:    chest pain, orthopnea, PND, swelling in lower extremities, anasarca,        dizziness, palpitations Resp:   shortness of breath with exertion or at rest.                productive cough,   non-productive cough, coughing up of blood.              change in color of mucus.  wheezing.   Skin:    rash or lesions. GI:  +heartburn, indigestion, abdominal pain, nausea, vomiting, diarrhea,                 change in bowel habits, loss of appetite GU: dysuria, change in color of urine, no urgency or frequency.   flank pain. MS:   joint pain, stiffness, decreased range of motion, back pain. Neuro-     nothing unusual Psych:  change in mood or affect.  depression or anxiety.   memory loss.   OBJ- Physical Exam General- Alert, Oriented, Affect-appropriate, Distress- none acute, +overweight Skin- rash-none, lesions- none, excoriation- none Lymphadenopathy- none Head- atraumatic            Eyes- Gross vision intact, PERRLA, conjunctivae and secretions clear            Ears- Hearing, canals-normal            Nose- Clear, no-Septal dev, mucus, polyps, erosion, perforation             Throat- Mallampati IV , mucosa clear , drainage- none, tonsils+, + teeth Neck- flexible , trachea midline, no stridor , thyroid  nl, carotid no bruit Chest - symmetrical excursion , unlabored           Heart/CV- RRR , no murmur , no gallop  , no rub, nl s1 s2                           - JVD- none , edema- none, stasis changes- none, varices- none           Lung- clear to P&A, wheeze- none, cough- none , dullness-none, rub- none           Chest  wall-  Abd-  Br/ Gen/ Rectal- Not done, not indicated Extrem- cyanosis- none, clubbing, none, atrophy- none, strength- nl Neuro- grossly intact to observation

## 2024-02-19 ENCOUNTER — Encounter: Payer: Self-pay | Admitting: Internal Medicine

## 2024-02-19 ENCOUNTER — Ambulatory Visit: Admitting: Internal Medicine

## 2024-02-19 VITALS — BP 122/70 | HR 86 | Temp 98.7°F | Ht 72.0 in | Wt 248.2 lb

## 2024-02-19 DIAGNOSIS — G4733 Obstructive sleep apnea (adult) (pediatric): Secondary | ICD-10-CM | POA: Diagnosis not present

## 2024-02-19 NOTE — Patient Instructions (Signed)
 Order- schedule home Sleep test  (on Inspire level 3)  dx OSA

## 2024-02-20 ENCOUNTER — Encounter: Payer: Self-pay | Admitting: Internal Medicine

## 2024-02-26 ENCOUNTER — Encounter (HOSPITAL_BASED_OUTPATIENT_CLINIC_OR_DEPARTMENT_OTHER)

## 2024-03-19 ENCOUNTER — Ambulatory Visit (HOSPITAL_BASED_OUTPATIENT_CLINIC_OR_DEPARTMENT_OTHER)

## 2024-03-19 DIAGNOSIS — G4733 Obstructive sleep apnea (adult) (pediatric): Secondary | ICD-10-CM

## 2024-03-22 ENCOUNTER — Other Ambulatory Visit (HOSPITAL_BASED_OUTPATIENT_CLINIC_OR_DEPARTMENT_OTHER): Payer: Self-pay | Admitting: Family Medicine

## 2024-03-22 ENCOUNTER — Other Ambulatory Visit: Payer: Self-pay

## 2024-03-22 ENCOUNTER — Other Ambulatory Visit (HOSPITAL_COMMUNITY): Payer: Self-pay

## 2024-03-22 ENCOUNTER — Encounter (HOSPITAL_COMMUNITY): Payer: Self-pay

## 2024-03-22 DIAGNOSIS — Z0000 Encounter for general adult medical examination without abnormal findings: Secondary | ICD-10-CM

## 2024-03-22 MED ORDER — MONTELUKAST SODIUM 10 MG PO TABS
10.0000 mg | ORAL_TABLET | Freq: Every day | ORAL | 3 refills | Status: AC
  Filled 2024-03-22: qty 90, 90d supply, fill #0
  Filled 2024-07-01: qty 90, 90d supply, fill #1

## 2024-03-22 MED ORDER — TRAZODONE HCL 150 MG PO TABS
150.0000 mg | ORAL_TABLET | Freq: Every evening | ORAL | 3 refills | Status: AC | PRN
  Filled 2024-03-22: qty 90, 90d supply, fill #0
  Filled 2024-07-01: qty 90, 90d supply, fill #1

## 2024-03-23 ENCOUNTER — Ambulatory Visit: Admitting: Internal Medicine

## 2024-03-31 DIAGNOSIS — G4733 Obstructive sleep apnea (adult) (pediatric): Secondary | ICD-10-CM | POA: Diagnosis not present

## 2024-04-17 ENCOUNTER — Other Ambulatory Visit (HOSPITAL_BASED_OUTPATIENT_CLINIC_OR_DEPARTMENT_OTHER): Payer: Self-pay | Admitting: Family Medicine

## 2024-04-19 ENCOUNTER — Encounter (HOSPITAL_BASED_OUTPATIENT_CLINIC_OR_DEPARTMENT_OTHER): Admitting: Pulmonary Disease

## 2024-04-29 ENCOUNTER — Other Ambulatory Visit (HOSPITAL_COMMUNITY): Payer: Self-pay

## 2024-04-29 MED FILL — Duloxetine HCl Enteric Coated Pellets Cap 60 MG (Base Eq): ORAL | 90 days supply | Qty: 90 | Fill #0 | Status: AC

## 2024-04-30 ENCOUNTER — Other Ambulatory Visit: Payer: Self-pay

## 2024-06-17 ENCOUNTER — Ambulatory Visit (HOSPITAL_BASED_OUTPATIENT_CLINIC_OR_DEPARTMENT_OTHER): Admitting: Family Medicine

## 2024-07-01 ENCOUNTER — Other Ambulatory Visit (HOSPITAL_COMMUNITY): Payer: Self-pay

## 2024-07-01 MED FILL — Duloxetine HCl Enteric Coated Pellets Cap 60 MG (Base Eq): ORAL | 90 days supply | Qty: 90 | Fill #1 | Status: CN

## 2024-07-02 ENCOUNTER — Other Ambulatory Visit: Payer: Self-pay

## 2024-07-02 ENCOUNTER — Other Ambulatory Visit (HOSPITAL_COMMUNITY): Payer: Self-pay

## 2024-07-06 ENCOUNTER — Ambulatory Visit (HOSPITAL_BASED_OUTPATIENT_CLINIC_OR_DEPARTMENT_OTHER): Admitting: Family Medicine

## 2024-07-21 ENCOUNTER — Ambulatory Visit (HOSPITAL_BASED_OUTPATIENT_CLINIC_OR_DEPARTMENT_OTHER): Admitting: Family Medicine

## 2024-07-22 ENCOUNTER — Encounter (HOSPITAL_BASED_OUTPATIENT_CLINIC_OR_DEPARTMENT_OTHER): Admitting: Pulmonary Disease
